# Patient Record
Sex: Male | Born: 1945 | Race: Black or African American | Hispanic: No | Marital: Married | Smoking: Former smoker
Health system: Southern US, Community
[De-identification: ages and names within clinical notes are randomized; demographics above are authoritative.]

## PROBLEM LIST (undated history)

## (undated) DIAGNOSIS — J441 Chronic obstructive pulmonary disease with (acute) exacerbation: Secondary | ICD-10-CM

## (undated) DIAGNOSIS — E119 Type 2 diabetes mellitus without complications: Secondary | ICD-10-CM

## (undated) DIAGNOSIS — E785 Hyperlipidemia, unspecified: Secondary | ICD-10-CM

## (undated) DIAGNOSIS — K219 Gastro-esophageal reflux disease without esophagitis: Secondary | ICD-10-CM

## (undated) DIAGNOSIS — I10 Essential (primary) hypertension: Secondary | ICD-10-CM

## (undated) DIAGNOSIS — M79659 Pain in unspecified thigh: Secondary | ICD-10-CM

## (undated) DIAGNOSIS — J449 Chronic obstructive pulmonary disease, unspecified: Secondary | ICD-10-CM

## (undated) DIAGNOSIS — Z860100 Personal history of colon polyps, unspecified: Secondary | ICD-10-CM

## (undated) DIAGNOSIS — D649 Anemia, unspecified: Secondary | ICD-10-CM

## (undated) DIAGNOSIS — K5790 Diverticulosis of intestine, part unspecified, without perforation or abscess without bleeding: Secondary | ICD-10-CM

## (undated) HISTORY — DX: Diverticulosis of intestine, part unspecified, without perforation or abscess without bleeding: K57.90

## (undated) HISTORY — DX: Type 2 diabetes mellitus without complications: E11.9

## (undated) HISTORY — DX: Chronic obstructive pulmonary disease with (acute) exacerbation: J44.1

## (undated) HISTORY — DX: Personal history of colon polyps, unspecified: Z86.0100

## (undated) HISTORY — DX: Hyperlipidemia, unspecified: E78.5

## (undated) HISTORY — DX: Chronic obstructive pulmonary disease, unspecified: J44.9

## (undated) HISTORY — DX: Pain in unspecified thigh: M79.659

## (undated) HISTORY — DX: Anemia, unspecified: D64.9

## (undated) HISTORY — DX: Gastro-esophageal reflux disease without esophagitis: K21.9

## (undated) HISTORY — DX: Personal history of colonic polyps: Z86.010

## (undated) HISTORY — DX: Essential (primary) hypertension: I10

---

## 1998-11-10 ENCOUNTER — Other Ambulatory Visit: Admission: RE | Admit: 1998-11-10 | Discharge: 1998-11-10 | Payer: Self-pay | Admitting: Internal Medicine

## 2000-09-30 ENCOUNTER — Other Ambulatory Visit: Admission: RE | Admit: 2000-09-30 | Discharge: 2000-09-30 | Payer: Self-pay | Admitting: Internal Medicine

## 2000-09-30 ENCOUNTER — Encounter (INDEPENDENT_AMBULATORY_CARE_PROVIDER_SITE_OTHER): Payer: Self-pay | Admitting: Specialist

## 2000-12-22 ENCOUNTER — Encounter: Payer: Self-pay | Admitting: Pulmonary Disease

## 2000-12-22 ENCOUNTER — Ambulatory Visit (HOSPITAL_COMMUNITY): Admission: RE | Admit: 2000-12-22 | Discharge: 2000-12-22 | Payer: Self-pay | Admitting: Pulmonary Disease

## 2001-02-24 ENCOUNTER — Encounter: Payer: Self-pay | Admitting: Emergency Medicine

## 2001-02-24 ENCOUNTER — Emergency Department (HOSPITAL_COMMUNITY): Admission: EM | Admit: 2001-02-24 | Discharge: 2001-02-25 | Payer: Self-pay | Admitting: Emergency Medicine

## 2001-07-03 ENCOUNTER — Inpatient Hospital Stay (HOSPITAL_COMMUNITY): Admission: EM | Admit: 2001-07-03 | Discharge: 2001-07-07 | Payer: Self-pay | Admitting: Emergency Medicine

## 2001-07-03 ENCOUNTER — Encounter: Payer: Self-pay | Admitting: Critical Care Medicine

## 2001-07-06 ENCOUNTER — Encounter: Payer: Self-pay | Admitting: Pulmonary Disease

## 2002-03-09 ENCOUNTER — Ambulatory Visit (HOSPITAL_COMMUNITY): Admission: RE | Admit: 2002-03-09 | Discharge: 2002-03-09 | Payer: Self-pay | Admitting: Endocrinology

## 2002-03-09 ENCOUNTER — Encounter: Payer: Self-pay | Admitting: Endocrinology

## 2002-04-25 HISTORY — PX: ESOPHAGOGASTRODUODENOSCOPY: SHX1529

## 2002-05-01 ENCOUNTER — Ambulatory Visit (HOSPITAL_COMMUNITY): Admission: RE | Admit: 2002-05-01 | Discharge: 2002-05-01 | Payer: Self-pay | Admitting: Internal Medicine

## 2002-05-01 ENCOUNTER — Encounter: Payer: Self-pay | Admitting: Internal Medicine

## 2003-03-10 ENCOUNTER — Inpatient Hospital Stay (HOSPITAL_COMMUNITY): Admission: EM | Admit: 2003-03-10 | Discharge: 2003-03-13 | Payer: Self-pay | Admitting: Emergency Medicine

## 2003-03-10 ENCOUNTER — Encounter: Payer: Self-pay | Admitting: Emergency Medicine

## 2003-03-11 ENCOUNTER — Encounter: Payer: Self-pay | Admitting: Critical Care Medicine

## 2004-04-25 ENCOUNTER — Ambulatory Visit (HOSPITAL_COMMUNITY): Admission: RE | Admit: 2004-04-25 | Discharge: 2004-04-25 | Payer: Self-pay | Admitting: Endocrinology

## 2004-06-30 ENCOUNTER — Encounter: Payer: Self-pay | Admitting: Critical Care Medicine

## 2004-09-10 ENCOUNTER — Ambulatory Visit: Payer: Self-pay | Admitting: Critical Care Medicine

## 2004-11-27 ENCOUNTER — Ambulatory Visit: Payer: Self-pay | Admitting: Endocrinology

## 2005-02-12 ENCOUNTER — Ambulatory Visit: Payer: Self-pay | Admitting: Endocrinology

## 2005-02-19 ENCOUNTER — Ambulatory Visit: Payer: Self-pay | Admitting: Endocrinology

## 2005-06-25 ENCOUNTER — Ambulatory Visit: Payer: Self-pay | Admitting: Endocrinology

## 2005-07-02 ENCOUNTER — Ambulatory Visit: Payer: Self-pay | Admitting: Endocrinology

## 2005-08-20 ENCOUNTER — Ambulatory Visit: Payer: Self-pay | Admitting: Critical Care Medicine

## 2005-12-20 ENCOUNTER — Ambulatory Visit: Payer: Self-pay | Admitting: Pulmonary Disease

## 2006-01-27 ENCOUNTER — Ambulatory Visit: Payer: Self-pay | Admitting: Critical Care Medicine

## 2006-03-28 ENCOUNTER — Ambulatory Visit: Payer: Self-pay | Admitting: Endocrinology

## 2006-04-18 ENCOUNTER — Ambulatory Visit: Payer: Self-pay | Admitting: Endocrinology

## 2006-08-11 ENCOUNTER — Ambulatory Visit: Payer: Self-pay | Admitting: Internal Medicine

## 2006-08-16 ENCOUNTER — Ambulatory Visit: Payer: Self-pay | Admitting: Pulmonary Disease

## 2006-08-18 ENCOUNTER — Ambulatory Visit: Payer: Self-pay | Admitting: Endocrinology

## 2006-08-30 ENCOUNTER — Ambulatory Visit: Payer: Self-pay | Admitting: Critical Care Medicine

## 2006-09-28 ENCOUNTER — Ambulatory Visit: Payer: Self-pay | Admitting: Critical Care Medicine

## 2006-12-01 ENCOUNTER — Ambulatory Visit: Payer: Self-pay | Admitting: Critical Care Medicine

## 2007-03-13 ENCOUNTER — Ambulatory Visit: Payer: Self-pay | Admitting: Internal Medicine

## 2007-03-27 ENCOUNTER — Encounter: Payer: Self-pay | Admitting: Internal Medicine

## 2007-03-27 ENCOUNTER — Ambulatory Visit: Payer: Self-pay | Admitting: Internal Medicine

## 2007-05-30 ENCOUNTER — Ambulatory Visit: Payer: Self-pay | Admitting: Critical Care Medicine

## 2007-06-02 ENCOUNTER — Ambulatory Visit: Payer: Self-pay | Admitting: Endocrinology

## 2007-06-02 LAB — CONVERTED CEMR LAB
AST: 32 units/L (ref 0–37)
Alkaline Phosphatase: 64 units/L (ref 39–117)
BUN: 14 mg/dL (ref 6–23)
Basophils Absolute: 0 10*3/uL (ref 0.0–0.1)
Basophils Relative: 0.4 % (ref 0.0–1.0)
Bilirubin Urine: NEGATIVE
Bilirubin, Direct: 0.1 mg/dL (ref 0.0–0.3)
Chloride: 100 meq/L (ref 96–112)
Cholesterol: 160 mg/dL (ref 0–200)
Creatinine, Ser: 1 mg/dL (ref 0.4–1.5)
Eosinophils Absolute: 0 10*3/uL (ref 0.0–0.6)
GFR calc Af Amer: 98 mL/min
Ketones, ur: NEGATIVE mg/dL
Leukocytes, UA: NEGATIVE
Lymphocytes Relative: 16.7 % (ref 12.0–46.0)
Monocytes Absolute: 0.3 10*3/uL (ref 0.2–0.7)
Monocytes Relative: 3.5 % (ref 3.0–11.0)
Neutrophils Relative %: 79.1 % — ABNORMAL HIGH (ref 43.0–77.0)
Nitrite: NEGATIVE
Platelets: 237 10*3/uL (ref 150–400)
Potassium: 3.8 meq/L (ref 3.5–5.1)
RBC: 4.22 M/uL (ref 4.22–5.81)
Total Bilirubin: 0.7 mg/dL (ref 0.3–1.2)
Total Protein: 7.2 g/dL (ref 6.0–8.3)
Urobilinogen, UA: 0.2 (ref 0.0–1.0)
VLDL: 19 mg/dL (ref 0–40)

## 2007-06-05 ENCOUNTER — Encounter: Payer: Self-pay | Admitting: Endocrinology

## 2007-06-05 DIAGNOSIS — Z860100 Personal history of colon polyps, unspecified: Secondary | ICD-10-CM | POA: Insufficient documentation

## 2007-06-05 DIAGNOSIS — E785 Hyperlipidemia, unspecified: Secondary | ICD-10-CM

## 2007-06-05 DIAGNOSIS — J209 Acute bronchitis, unspecified: Secondary | ICD-10-CM

## 2007-06-05 DIAGNOSIS — I10 Essential (primary) hypertension: Secondary | ICD-10-CM | POA: Insufficient documentation

## 2007-06-05 DIAGNOSIS — J440 Chronic obstructive pulmonary disease with acute lower respiratory infection: Secondary | ICD-10-CM

## 2007-06-05 DIAGNOSIS — E119 Type 2 diabetes mellitus without complications: Secondary | ICD-10-CM | POA: Insufficient documentation

## 2007-06-05 DIAGNOSIS — K219 Gastro-esophageal reflux disease without esophagitis: Secondary | ICD-10-CM

## 2007-06-06 ENCOUNTER — Ambulatory Visit: Payer: Self-pay | Admitting: Endocrinology

## 2007-07-03 ENCOUNTER — Ambulatory Visit: Payer: Self-pay | Admitting: Critical Care Medicine

## 2007-08-31 ENCOUNTER — Encounter: Payer: Self-pay | Admitting: Critical Care Medicine

## 2007-10-02 ENCOUNTER — Ambulatory Visit: Payer: Self-pay | Admitting: Critical Care Medicine

## 2007-10-02 ENCOUNTER — Ambulatory Visit: Payer: Self-pay | Admitting: Endocrinology

## 2007-10-02 DIAGNOSIS — J441 Chronic obstructive pulmonary disease with (acute) exacerbation: Secondary | ICD-10-CM

## 2007-10-03 LAB — CONVERTED CEMR LAB: Microalb Creat Ratio: 5.3 mg/g (ref 0.0–30.0)

## 2007-12-05 ENCOUNTER — Ambulatory Visit: Payer: Self-pay | Admitting: Endocrinology

## 2007-12-05 DIAGNOSIS — M79609 Pain in unspecified limb: Secondary | ICD-10-CM

## 2007-12-06 LAB — CONVERTED CEMR LAB
Basophils Relative: 0.5 % (ref 0.0–1.0)
Eosinophils Absolute: 0.2 10*3/uL (ref 0.0–0.6)
Folate: 5.6 ng/mL
HCT: 35.5 % — ABNORMAL LOW (ref 39.0–52.0)
Hgb A1c MFr Bld: 6.2 % — ABNORMAL HIGH (ref 4.6–6.0)
Iron: 65 ug/dL (ref 42–165)
Lymphocytes Relative: 31.2 % (ref 12.0–46.0)
MCV: 88.7 fL (ref 78.0–100.0)
Monocytes Relative: 11 % (ref 3.0–11.0)
Neutrophils Relative %: 54.1 % (ref 43.0–77.0)
RDW: 13.2 % (ref 11.5–14.6)

## 2008-01-08 ENCOUNTER — Telehealth (INDEPENDENT_AMBULATORY_CARE_PROVIDER_SITE_OTHER): Payer: Self-pay | Admitting: *Deleted

## 2008-03-05 ENCOUNTER — Ambulatory Visit: Payer: Self-pay | Admitting: Critical Care Medicine

## 2008-03-26 ENCOUNTER — Telehealth (INDEPENDENT_AMBULATORY_CARE_PROVIDER_SITE_OTHER): Payer: Self-pay | Admitting: *Deleted

## 2008-04-09 ENCOUNTER — Ambulatory Visit: Payer: Self-pay | Admitting: Critical Care Medicine

## 2008-06-17 ENCOUNTER — Encounter: Payer: Self-pay | Admitting: Endocrinology

## 2008-07-15 ENCOUNTER — Ambulatory Visit: Payer: Self-pay | Admitting: Critical Care Medicine

## 2008-07-29 ENCOUNTER — Ambulatory Visit: Payer: Self-pay | Admitting: Endocrinology

## 2008-07-29 DIAGNOSIS — N320 Bladder-neck obstruction: Secondary | ICD-10-CM | POA: Insufficient documentation

## 2008-07-31 LAB — CONVERTED CEMR LAB
ALT: 41 units/L (ref 0–53)
Albumin: 4.1 g/dL (ref 3.5–5.2)
Bacteria, UA: NEGATIVE
Bilirubin Urine: NEGATIVE
Bilirubin, Direct: 0.1 mg/dL (ref 0.0–0.3)
Cholesterol: 158 mg/dL (ref 0–200)
Crystals: NEGATIVE
Eosinophils Absolute: 0.2 10*3/uL (ref 0.0–0.7)
GFR calc Af Amer: 88 mL/min
GFR calc non Af Amer: 72 mL/min
HCT: 35.7 % — ABNORMAL LOW (ref 39.0–52.0)
HDL: 71.3 mg/dL (ref >39.0)
Hemoglobin: 11.7 g/dL — ABNORMAL LOW (ref 13.0–17.0)
Hgb A1c MFr Bld: 5.9 % (ref 4.6–6.0)
Iron: 62 ug/dL (ref 42–165)
Ketones, ur: NEGATIVE mg/dL
LDL Cholesterol: 75 mg/dL (ref 0–99)
Leukocytes, UA: NEGATIVE
Lymphocytes Relative: 18 % (ref 12.0–46.0)
MCHC: 32.7 g/dL (ref 30.0–36.0)
Mucus, UA: NEGATIVE
Nitrite: NEGATIVE
PSA: 0.18 ng/mL (ref 0.10–4.00)
Potassium: 3.8 meq/L (ref 3.5–5.1)
RBC: 3.93 M/uL — ABNORMAL LOW (ref 4.22–5.81)
RDW: 14 % (ref 11.5–14.6)
Saturation Ratios: 16.1 % — ABNORMAL LOW (ref 20.0–50.0)
Sodium: 139 meq/L (ref 135–145)
Total CHOL/HDL Ratio: 2.2
Total Protein, Urine: NEGATIVE mg/dL
Total Protein: 7 g/dL (ref 6.0–8.3)
Transferrin: 275.7 mg/dL (ref >=212.0)
Triglycerides: 58 mg/dL (ref 0–149)
Urine Glucose: NEGATIVE mg/dL
Urobilinogen, UA: 0.2 (ref 0.0–1.0)
VLDL: 12 mg/dL (ref 0–40)
WBC: 9.5 10*3/uL (ref 4.5–10.5)

## 2008-08-16 ENCOUNTER — Encounter: Payer: Self-pay | Admitting: Critical Care Medicine

## 2008-08-29 ENCOUNTER — Encounter (INDEPENDENT_AMBULATORY_CARE_PROVIDER_SITE_OTHER): Payer: Self-pay | Admitting: *Deleted

## 2008-09-02 ENCOUNTER — Ambulatory Visit: Payer: Self-pay | Admitting: Endocrinology

## 2008-09-02 DIAGNOSIS — D509 Iron deficiency anemia, unspecified: Secondary | ICD-10-CM | POA: Insufficient documentation

## 2008-09-02 DIAGNOSIS — R05 Cough: Secondary | ICD-10-CM

## 2008-09-16 ENCOUNTER — Ambulatory Visit: Payer: Self-pay | Admitting: Internal Medicine

## 2008-09-17 ENCOUNTER — Telehealth (INDEPENDENT_AMBULATORY_CARE_PROVIDER_SITE_OTHER): Payer: Self-pay | Admitting: *Deleted

## 2008-09-30 ENCOUNTER — Ambulatory Visit: Payer: Self-pay | Admitting: Internal Medicine

## 2008-09-30 DIAGNOSIS — K2970 Gastritis, unspecified, without bleeding: Secondary | ICD-10-CM | POA: Insufficient documentation

## 2008-09-30 DIAGNOSIS — K2990 Gastroduodenitis, unspecified, without bleeding: Secondary | ICD-10-CM

## 2008-09-30 LAB — CONVERTED CEMR LAB: UREASE: NEGATIVE

## 2008-10-08 ENCOUNTER — Ambulatory Visit: Payer: Self-pay | Admitting: Critical Care Medicine

## 2009-05-05 ENCOUNTER — Ambulatory Visit: Payer: Self-pay | Admitting: Critical Care Medicine

## 2009-08-25 ENCOUNTER — Ambulatory Visit: Payer: Self-pay | Admitting: Critical Care Medicine

## 2009-09-01 ENCOUNTER — Telehealth (INDEPENDENT_AMBULATORY_CARE_PROVIDER_SITE_OTHER): Payer: Self-pay | Admitting: *Deleted

## 2009-09-22 ENCOUNTER — Ambulatory Visit: Payer: Self-pay | Admitting: Critical Care Medicine

## 2009-09-22 DIAGNOSIS — J328 Other chronic sinusitis: Secondary | ICD-10-CM | POA: Insufficient documentation

## 2009-10-06 ENCOUNTER — Ambulatory Visit: Payer: Self-pay | Admitting: Cardiology

## 2009-10-07 ENCOUNTER — Encounter: Payer: Self-pay | Admitting: Critical Care Medicine

## 2009-10-27 ENCOUNTER — Telehealth: Payer: Self-pay | Admitting: Critical Care Medicine

## 2009-10-28 ENCOUNTER — Telehealth: Payer: Self-pay | Admitting: Critical Care Medicine

## 2010-03-17 ENCOUNTER — Telehealth: Payer: Self-pay | Admitting: Critical Care Medicine

## 2010-03-31 ENCOUNTER — Ambulatory Visit: Payer: Self-pay | Admitting: Critical Care Medicine

## 2010-05-04 ENCOUNTER — Telehealth (INDEPENDENT_AMBULATORY_CARE_PROVIDER_SITE_OTHER): Payer: Self-pay | Admitting: *Deleted

## 2010-05-12 ENCOUNTER — Ambulatory Visit: Payer: Self-pay | Admitting: Critical Care Medicine

## 2010-06-10 ENCOUNTER — Encounter: Payer: Self-pay | Admitting: Critical Care Medicine

## 2010-07-14 ENCOUNTER — Ambulatory Visit: Payer: Self-pay | Admitting: Critical Care Medicine

## 2010-07-24 ENCOUNTER — Ambulatory Visit: Payer: Self-pay | Admitting: Critical Care Medicine

## 2010-11-14 ENCOUNTER — Encounter: Payer: Self-pay | Admitting: Endocrinology

## 2010-11-24 NOTE — Assessment & Plan Note (Signed)
Summary: Pulmonary OV   Primary Provider/Referring Provider:  Dr Romero Belling  CC:  1 wk follow up.  Pt states he is doing better.  Dry cough mainly at night - requesting cough syrup for this.  Denies SOB, wheezing, and chest tightness.  Requesting flu vac.Marland Kitchen  History of Present Illness:  65  years old male with known history of COPD and asthmatic bronchitis   March 31, 2010 --Presents for an acute office visit. Complains of prod cough with thick brown mucus over last 3-4 weeks . Worse for last week, keeping him up at night. Denies chest pain, dyspnea, orthopnea, hemoptysis, fever, n/v/d, edema, headache,recent travel or antibiotics.  Uisng nyquil wihtout relief.    May 12, 2010 9:21 AM The pt was ok but every night will choke up and mucus at night.  The mucus was  white and green and now is brown.  This started 3-4 days after TP saw the pt on 03/31/10.  Has a cold all the time.  Dyspnea is ok, just cough.  Not much nasal drip.  No sinus pressure or headaches.  No smoking.  The pt is now working in [place] area.  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  No actual dust.  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    July 14, 2010 2:10 PM  Pt is worse with more cough especially at night.  Has to use nebulizer at night. Worse in damp weather.  No sinus drip.  No real chest pain ,  coughing up thick brown mucus more wheezing is noted.  Notes more heartburn.    July 24, 2010 3:46 PM Doing better.  Not as much cough and is at night and is dry.  Not using nebulizer now.  No chest pain. Wheezing is less.  Not as much brown mucus.     Preventive Screening-Counseling & Management  Alcohol-Tobacco     Smoking Status: quit > 6 months     Year Quit: 1968     Pack years: 40yrs, 1ppd  Current Medications (verified): 1)  Vytorin 10-80 Mg  Tabs (Ezetimibe-Simvastatin) .... Take 1 By Mouth At Bedtime 2)  Flomax 0.4 Mg  Cp24 (Tamsulosin Hcl) .... One By Mouth  Daily 3)  Diovan Hct 320-25 Mg  Tabs (Valsartan-Hydrochlorothiazide) .... Take 1 By Mouth Once Daily 4)  Metformin Hcl 500 Mg Xr24h-Tab (Metformin Hcl) .... Once Daily 5)  Advair Diskus 500-50 Mcg/dose Misc (Fluticasone-Salmeterol) .... Inhale 1 Puff Two Times A Day 6)  Spiriva Handihaler 18 Mcg Caps (Tiotropium Bromide Monohydrate) .... Inhale Contents of 1 Capsule Once A Day 7)  Singulair 10 Mg Tabs (Montelukast Sodium) .... Take 1 Tablet By Mouth Once A Day 8)  Ventolin Hfa 108 (90 Base) Mcg/act Aers (Albuterol Sulfate) .... Inhale 2 Puffs Every Four Hours As Needed 9)  Prednisone 10 Mg  Tabs (Prednisone) .... Take As Directed 4 Each Am X3days, 3 X 3days, 2 X 3days, 1 X 3days Then Stop  Allergies (verified): No Known Drug Allergies  Past History:  Past medical, surgical, family and social histories (including risk factors) reviewed, and no changes noted (except as noted below).  Past Medical History: Reviewed history from 07/15/2008 and no changes required. THIGH PAIN (ICD-729.5) UNSPECIFIED ANEMIA (ICD-285.9) BRONCHITIS, OBSTRUCTIVE CHRONIC, WITH EXACERBATION (ICD-491.21) HYPERTENSION (ICD-401.9) HYPERLIPIDEMIA (ICD-272.4) GERD (ICD-530.81) DIABETES MELLITUS, TYPE II (ICD-250.00) COPD (ICD-496) COLONIC POLYPS, HX OF (ICD-V12.72)    Past Surgical History: Reviewed history from 06/05/2007 and  no changes required. EGD (04/25/2002  Family History: Reviewed history from 10/02/2007 and no changes required. Peptic ulcer dz father with throat CA  Social History: Reviewed history from 07/14/2010 and no changes required. former smoker, x10-48yrs, less than 1ppd.  Quit in 1970s. married 5 children drinks some whiskey on the weekends drinks 2 cups caffeine a day states no exercise  Review of Systems       The patient complains of shortness of breath with activity and non-productive cough.  The patient denies shortness of breath at rest, productive cough, coughing up blood,  chest pain, irregular heartbeats, acid heartburn, indigestion, loss of appetite, weight change, abdominal pain, difficulty swallowing, sore throat, tooth/dental problems, headaches, nasal congestion/difficulty breathing through nose, sneezing, itching, ear ache, anxiety, depression, hand/feet swelling, joint stiffness or pain, rash, change in color of mucus, and fever.    Vital Signs:  Patient profile:   65 year old male Height:      66 inches Weight:      229 pounds BMI:     37.10 O2 Sat:      95 % on Room air Temp:     98.3 degrees F oral Pulse rate:   74 / minute BP sitting:   132 / 84  (left arm) Cuff size:   regular  Vitals Entered By: Gweneth Dimitri RN (July 24, 2010 3:37 PM)  O2 Flow:  Room air CC: 1 wk follow up.  Pt states he is doing better.  Dry cough mainly at night - requesting cough syrup for this.  Denies SOB, wheezing, chest tightness.  Requesting flu vac. Comments Medications reviewed with patient Daytime contact number verified with patient. Gweneth Dimitri RN  July 24, 2010 3:37 PM    Physical Exam  Additional Exam:  Gen: Pleasant, well-nourished, in no distress , normal affect ENT: no lesions, purulent nares bilaterally persists Neck: No JVD, no TMG, no carotid bruits Lungs: No use of accessory muscles, no dullness to percussion, decreased wheeze and rhonchi Cardiovascular: RRR, heart sounds normal, no murmurs or gallops, no peripheral edema Abdomen: soft and non-tender, no HSM, BS normal Musculoskeletal: No deformities, no cyanosis or clubbing Neuro: alert, non-focal     Impression & Recommendations:  Problem # 1:  BRONCHITIS, OBSTRUCTIVE CHRONIC, WITH EXACERBATION (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   Maintain treatment program as currently prescribed. as needed codeine cough syrup Orders: Est. Patient Level III (09811)  Medications Added to Medication List  This Visit: 1)  Promethazine-codeine 6.25-10 Mg/17ml Syrp (Promethazine-codeine) .... 5 ml by mouth every 6 hours as needed cough  Complete Medication List: 1)  Vytorin 10-80 Mg Tabs (Ezetimibe-simvastatin) .... Take 1 by mouth at bedtime 2)  Flomax 0.4 Mg Cp24 (Tamsulosin hcl) .... One by mouth daily 3)  Diovan Hct 320-25 Mg Tabs (Valsartan-hydrochlorothiazide) .... Take 1 by mouth once daily 4)  Metformin Hcl 500 Mg Xr24h-tab (Metformin hcl) .... Once daily 5)  Advair Diskus 500-50 Mcg/dose Misc (Fluticasone-salmeterol) .... Inhale 1 puff two times a day 6)  Spiriva Handihaler 18 Mcg Caps (Tiotropium bromide monohydrate) .... Inhale contents of 1 capsule once a day 7)  Singulair 10 Mg Tabs (Montelukast sodium) .... Take 1 tablet by mouth once a day 8)  Ventolin Hfa 108 (90 Base) Mcg/act Aers (Albuterol sulfate) .... Inhale 2 puffs every four hours as needed 9)  Promethazine-codeine 6.25-10 Mg/105ml Syrp (Promethazine-codeine) .... 5 ml by mouth every 6 hours as needed cough  Other  Orders: Admin 1st Vaccine (09811) Flu Vaccine 71yrs + [id number])  Patient Instructions: 1)  Flu vaccine  2)  No other changes in medications 3)  Return 3 months  Prescriptions: PROMETHAZINE-CODEINE 6.25-10 MG/5ML SYRP (PROMETHAZINE-CODEINE) 5 ML by mouth every 6 hours as needed cough  #240ML x 0   Entered and Authorized by:   Storm Frisk MD   Signed by:   Storm Frisk MD on 07/24/2010   Method used:   Print then Give to Patient   RxID:   [id number]              Flu Vaccine Consent Questions     Do you have a history of severe allergic reactions to this vaccine? no    Any prior history of allergic reactions to egg and/or gelatin? no    Do you have a sensitivity to the preservative Thimersol? no    Do you have a past history of Guillan-Barre Syndrome? no    Do you currently have an acute febrile illness? no    Have you ever had a severe reaction to latex? no    Vaccine information  given and explained to patient? yes    Are you currently pregnant? no    Lot Number:[id number]   Exp Date:04/24/2011   Site Given  Left Deltoid IMflu Zackery Barefoot CMA  July 24, 2010 4:42 PM

## 2010-11-24 NOTE — Progress Notes (Signed)
Summary: samples  Phone Note Call from Patient Call back at [phone]   Caller: Patient Call For: Kalecia Hartney Reason for Call: Talk to Nurse Summary of Call: can he get Spiriva samples to last until Friday?  He gets his new insurance card then. Initial call taken by: Eugene Gavia,  October 27, 2009 3:17 PM  Follow-up for Phone Call        pt advised samples at front. [name] CMA  October 27, 2009 3:20 PM

## 2010-11-24 NOTE — Progress Notes (Signed)
Summary: samples  Phone Note Call from Patient Call back at [phone]   Caller: Patient Call For: Monserrate Blaschke Reason for Call: Talk to Nurse Summary of Call: Pt called to see if his ,ail order had sent this med--they had not.  They have processed his order now, but it will take 7-10 days.  Can he get samples of Spiriva and Diovan Initial call taken by: Eugene Gavia,  Mar 17, 2010 9:27 AM  Follow-up for Phone Call        pt advised samples of spiriva nad diovan left at front. [name] CMA  Mar 17, 2010 10:16 AM

## 2010-11-24 NOTE — Assessment & Plan Note (Signed)
Summary: Acute NP office visit - COPD, bronchitis   Primary Provider/Referring Provider:  Dr Romero Belling  CC:  prod cough with thick brown mucus x15months - denies wheezing, SOB, and f/c/s.  History of Present Illness:  65  years old male with known history of COPD and asthmatic bronchitis    9/21:follow up. now works  in [place].  very humid climate. now :  some at bedtime cough.  cough is dry this week.  last week had brown yellow mucous. no cp.  sl wheeze.    May 05, 2009--Presents for an acute office visit. Complains of prod cough with light brown mucus, was darker over the weekend x1week. Using mucinex which is helping. Intermittent wheezing. Denies chest pain, dyspnea, orthopnea, hemoptysis, fever, n/v/d, edema, headache,recent travel or antibiotics.   August 25, 2009 4:23 PM Pt notes having a "cold" for month:  His symptoms are more cough in night and early in the am and mucous is brown. No wheeze.  Not as dyspneic.  No chest pain.  No f/c/s. On advair and spiriva daily  September 22, 2009 10:03 AM At last ov we rx: Follow Reflux diet Take aciphex one a day until samples gone then stop Stay on inhalers as prescribed Prednisone pulse Augmentin twice daily for 10days The pt feels better now. The pt notes that  after off meds will start coughing brown mucous  again.   No sinus drainage. No sinus pressure.  No fever.  Now no wheeze. The pt  is dyspneic with exertion. March 31, 2010 --Presents for an acute office visit. Complains of prod cough with thick brown mucus over last 3-4 weeks . Worse for last week, keeping him up at night. Denies chest pain, dyspnea, orthopnea, hemoptysis, fever, n/v/d, edema, headache,recent travel or antibiotics.  Uisng nyquil wihtout relief.   Medications Prior to Update: 1)  Vytorin 10-80 Mg  Tabs (Ezetimibe-Simvastatin) .... Take 1 By Mouth At Bedtime 2)  Flomax 0.4 Mg  Cp24 (Tamsulosin Hcl) .... One By Mouth Daily 3)  Diovan Hct 320-25 Mg  Tabs  (Valsartan-Hydrochlorothiazide) .... Take 1 By Mouth Once Daily 4)  Robaxin-750 750 Mg  Tabs (Methocarbamol) .... Take 1 Tablet By Mouth Once A Day As Needed For Cramps 5)  Metformin Hcl 500 Mg Xr24h-Tab (Metformin Hcl) .... Qd 6)  Tussionex Pennkinetic Er 8-10 Mg/52ml Lqcr (Chlorpheniramine-Hydrocodone) .Marland Kitchen.. 1 Tsp Two Times A Day As Needed Cough, May Make You Sleepy 7)  Advair Diskus 500-50 Mcg/dose Misc (Fluticasone-Salmeterol) .... Inhale 1 Puff Two Times A Day 8)  Spiriva Handihaler 18 Mcg Caps (Tiotropium Bromide Monohydrate) .... Inhale Contents of 1 Capsule Once A Day 9)  Singulair 10 Mg Tabs (Montelukast Sodium) .... Take 1 Tablet By Mouth Once A Day 10)  Ventolin Hfa 108 (90 Base) Mcg/act Aers (Albuterol Sulfate) .... Inhale 2 Puffs Every Four Hours As Needed  Current Medications (verified): 1)  Vytorin 10-80 Mg  Tabs (Ezetimibe-Simvastatin) .... Take 1 By Mouth At Bedtime 2)  Flomax 0.4 Mg  Cp24 (Tamsulosin Hcl) .... One By Mouth Daily 3)  Diovan Hct 320-25 Mg  Tabs (Valsartan-Hydrochlorothiazide) .... Take 1 By Mouth Once Daily 4)  Robaxin-750 750 Mg  Tabs (Methocarbamol) .... Take 1 Tablet By Mouth Once A Day As Needed For Cramps 5)  Metformin Hcl 500 Mg Xr24h-Tab (Metformin Hcl) .... Once Daily 6)  Advair Diskus 500-50 Mcg/dose Misc (Fluticasone-Salmeterol) .... Inhale 1 Puff Two Times A Day 7)  Spiriva Handihaler 18 Mcg Caps (Tiotropium Bromide  Monohydrate) .... Inhale Contents of 1 Capsule Once A Day 8)  Singulair 10 Mg Tabs (Montelukast Sodium) .... Take 1 Tablet By Mouth Once A Day 9)  Ventolin Hfa 108 (90 Base) Mcg/act Aers (Albuterol Sulfate) .... Inhale 2 Puffs Every Four Hours As Needed  Allergies (verified): No Known Drug Allergies  Past History:  Past Medical History: Last updated: 07/15/2008 THIGH PAIN (ICD-729.5) UNSPECIFIED ANEMIA (ICD-285.9) BRONCHITIS, OBSTRUCTIVE CHRONIC, WITH EXACERBATION (ICD-491.21) HYPERTENSION (ICD-401.9) HYPERLIPIDEMIA  (ICD-272.4) GERD (ICD-530.81) DIABETES MELLITUS, TYPE II (ICD-250.00) COPD (ICD-496) COLONIC POLYPS, HX OF (ICD-V12.72)    Family History: Last updated: 10/02/2007 Peptic ulcer dz father with throat CA  Social History: Last updated: 05/05/2009 former smoker, x10-43yrs, less than 1ppd married 5 children drinks some whiskey on the weekends drinks 2 cups caffeine a day states no exercise  Risk Factors: Smoking Status: quit > 6 months (09/22/2009)  Review of Systems      See HPI  Vital Signs:  Patient profile:   65 year old male Height:      66 inches Weight:      215 pounds BMI:     34.83 O2 Sat:      96 % on Room air Temp:     97.6 degrees F oral Pulse rate:   79 / minute BP sitting:   144 / 94  (left arm) Cuff size:   regular  Vitals Entered By: Boone Master CNA/MA (March 31, 2010 10:24 AM)  O2 Flow:  Room air CC: prod cough with thick brown mucus x64months - denies wheezing, SOB, f/c/s Is Patient Diabetic? Yes Comments Medications reviewed with patient Daytime contact number verified with patient. Boone Master CNA/MA  March 31, 2010 10:23 AM    Physical Exam  Additional Exam:  Gen: Pleasant, well-nourished, in no distress , normal affect ENT: no lesions, purulent nares bilaterally persists Neck: No JVD, no TMG, no carotid bruits Lungs: No use of accessory muscles, no dullness to percussion, insp/exp wheeze, scattered rhonchi Cardiovascular: RRR, heart sounds normal, no murmurs or gallops, no peripheral edema Abdomen: soft and non-tender, no HSM, BS normal Musculoskeletal: No deformities, no cyanosis or clubbing Neuro: alert, non-focal     Impression & Recommendations:  Problem # 1:  BRONCHITIS, OBSTRUCTIVE CHRONIC, WITH EXACERBATION (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  Hydromet  1-2 tsp every 4-6 hr as needed cough, may make you sleepy.  Please contact office for sooner follow up if symptoms do not improve or worsen   Orders: Est. Patient Level IV (29562) T-2 View CXR (71020TC)  Medications Added to Medication List This Visit: 1)  Metformin Hcl 500 Mg Xr24h-tab (Metformin hcl) .... Once daily 2)  Augmentin 875-125 Mg Tabs (Amoxicillin-pot clavulanate) .Marland Kitchen.. 1 by mouth two times a day 3)  Prednisone 10 Mg Tabs (Prednisone) .... 4 tabs for 2 days, then 3 tabs for 2 days, 2 tabs for 2 days, then 1 tab for 2 days, then stop 4)  Hydromet 5-1.5 Mg/65ml Syrp (Hydrocodone-homatropine) .Marland Kitchen.. 1-2 tsp every 4-6 hrs as needed cough  Complete Medication List: 1)  Vytorin 10-80 Mg Tabs (Ezetimibe-simvastatin) .... Take 1 by mouth at bedtime 2)  Flomax 0.4 Mg Cp24 (Tamsulosin hcl) .... One by mouth daily 3)  Diovan Hct 320-25 Mg Tabs (Valsartan-hydrochlorothiazide) .... Take 1 by mouth  once daily 4)  Robaxin-750 750 Mg Tabs (Methocarbamol) .... Take 1 tablet by mouth once a day as needed for cramps 5)  Metformin Hcl 500 Mg Xr24h-tab (Metformin hcl) .... Once daily 6)  Advair Diskus 500-50 Mcg/dose Misc (Fluticasone-salmeterol) .... Inhale 1 puff two times a day 7)  Spiriva Handihaler 18 Mcg Caps (Tiotropium bromide monohydrate) .... Inhale contents of 1 capsule once a day 8)  Singulair 10 Mg Tabs (Montelukast sodium) .... Take 1 tablet by mouth once a day 9)  Ventolin Hfa 108 (90 Base) Mcg/act Aers (Albuterol sulfate) .... Inhale 2 puffs every four hours as needed 10)  Augmentin 875-125 Mg Tabs (Amoxicillin-pot clavulanate) .Marland Kitchen.. 1 by mouth two times a day 11)  Prednisone 10 Mg Tabs (Prednisone) .... 4 tabs for 2 days, then 3 tabs for 2 days, 2 tabs for 2 days, then 1 tab for 2 days, then stop 12)  Hydromet 5-1.5 Mg/59ml Syrp (Hydrocodone-homatropine) .Marland Kitchen.. 1-2 tsp every 4-6 hrs as needed cough  Patient Instructions: 1)  Augmentin 875mg  two times a day for 7 days 2)  Mucinex DM two  times a day as needed cough/congestion  3)  Prednisone taper over next week  4)  I will call with xray results.  5)  Hydromet 1-2 tsp every 4-6 hr as needed cough, may make you sleepy.  6)  Please contact office for sooner follow up if symptoms do not improve or worsen  7)  follow up Dr. Delford Field in 4 weeks AT [facility]  Prescriptions: HYDROMET 5-1.5 MG/5ML SYRP (HYDROCODONE-HOMATROPINE) 1-2 tsp every 4-6 hrs as needed cough  #8 oz x 0   Entered and Authorized by:   Rubye Oaks NP   Signed by:   Tammy Parrett NP on 03/31/2010   Method used:   Print then Give to Patient   RxID:   [id number] PREDNISONE 10 MG TABS (PREDNISONE) 4 tabs for 2 days, then 3 tabs for 2 days, 2 tabs for 2 days, then 1 tab for 2 days, then stop  #20 x 0   Entered and Authorized by:   Rubye Oaks NP   Signed by:   Rubye Oaks NP on 03/31/2010   Method used:   Electronically to        [facility] [phone]* (retail)       [address] [place], [place]  [address]       Ph: [phone]       Fax: [phone]   RxID:   [id number] AUGMENTIN 875-125 MG TABS (AMOXICILLIN-POT CLAVULANATE) 1 by mouth two times a day  #14 x 0   Entered and Authorized by:   Rubye Oaks NP   Signed by:   Rubye Oaks NP on 03/31/2010   Method used:   Electronically to        [facility] [phone]* (retail)       [address] [place], [place]  [address]       Ph: [phone]       Fax: [phone]   RxID:   [id number]     Appended Document: Orders Update - neb tx     Clinical Lists Changes  Orders: Added new Service order of Nebulizer Tx (20254) - Signed

## 2010-11-24 NOTE — Progress Notes (Signed)
Summary: nebulizer   [id number]  Phone Note Call from Patient Call back at Home Phone [phone]   Caller: Patient Call For: wright Summary of Call: pts nebulizer has broken and is not working what should he do Initial call taken by: Lacinda Axon,  May 04, 2010 12:17 PM  Follow-up for Phone Call        [facility].  Pt just needs to contact his DME company regarding this if he believes his neb machine is broken.  Aundra Millet Reynolds LPN  May 04, 2010 12:19 PM   per dr Delford Field ok for order for new neb machine--will send to [facility] Follow-up by: Philipp Deputy CMA,  May 04, 2010 4:31 PM

## 2010-11-24 NOTE — Progress Notes (Signed)
Summary: medication  Phone Note Call from Patient Call back at [phone]   Caller: Patient Call For: [name] Summary of Call: need regular medication change to 3 month supply and mail to pt Initial call taken by: Rickard Patience,  October 28, 2009 10:39 AM  Follow-up for Phone Call        pt request rx for adviar, singulair, spiriva and ventolin 90 day supply with refills be mailed to pt. I will print and give to PW to sign., and then mail to pt. Pt aware. [name] CMA  October 28, 2009 11:41 AM     Prescriptions: VENTOLIN HFA 108 (90 BASE) MCG/ACT AERS (ALBUTEROL SULFATE) Inhale 2 puffs every four hours as needed  #3 x 3   Entered by:   [name] CMA   Authorized by:   Storm Frisk MD   Signed by:   [name] CMA on 10/28/2009   Method used:   Print then Mail to Patient   RxID:   [id number] SINGULAIR 10 MG TABS (MONTELUKAST SODIUM) Take 1 tablet by mouth once a day  #90 x 3   Entered by:   [name] CMA   Authorized by:   Storm Frisk MD   Signed by:   [name] CMA on 10/28/2009   Method used:   Print then Mail to Patient   RxID:   [id number] SPIRIVA HANDIHALER 18 MCG CAPS (TIOTROPIUM BROMIDE MONOHYDRATE) Inhale contents of 1 capsule once a day  #90 x 3   Entered by:   [name] CMA   Authorized by:   Storm Frisk MD   Signed by:   [name] CMA on 10/28/2009   Method used:   Print then Mail to Patient   RxID:   [id number] ADVAIR DISKUS 500-50 MCG/DOSE MISC (FLUTICASONE-SALMETEROL) Inhale 1 puff two times a day  #3 x 3   Entered by:   [name] CMA   Authorized by:   Storm Frisk MD   Signed by:   [name] CMA on 10/28/2009   Method used:   Print then Mail to Patient   RxID:   [id number]   Appended Document: medication rxs signed by PW and mailed to pt.

## 2010-11-24 NOTE — Assessment & Plan Note (Signed)
Summary: Pulmonary OV   Primary Provider/Referring Provider:  Dr Romero Belling  CC:  Acute Visit.  increased SOB, wheezing, and chest tightness at night since Saturday night.  Prod cough with thick brown mucus during the day.  Denies f/c/s.Marland Kitchen  History of Present Illness:  65  years old male with known history of COPD and asthmatic bronchitis   March 31, 2010 --Presents for an acute office visit. Complains of prod cough with thick brown mucus over last 3-4 weeks . Worse for last week, keeping him up at night. Denies chest pain, dyspnea, orthopnea, hemoptysis, fever, n/v/d, edema, headache,recent travel or antibiotics.  Uisng nyquil wihtout relief.    May 12, 2010 9:21 AM The pt was ok but every night will choke up and mucus at night.  The mucus was  white and green and now is brown.  This started 3-4 days after TP saw the pt on 03/31/10.  Has a cold all the time.  Dyspnea is ok, just cough.  Not much nasal drip.  No sinus pressure or headaches.  No smoking.  The pt is now working in [place] area.  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  No actual dust.  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    July 14, 2010 2:10 PM  Pt is worse with more cough especially at night.  Has to use nebulizer at night. Worse in damp weather.  No sinus drip.  No real chest pain ,  coughing up thick brown mucus more wheezing is noted.  Notes more heartburn.    Asthma History    Initial Asthma Severity Rating:    Age range: 12+ years    Symptoms: throughout the day    Nighttime Awakenings: often 7/week    Interferes w/ normal activity: some limitations    SABA use (not for EIB): daily    Asthma Severity Assessment: Severe Persistent   Preventive Screening-Counseling & Management  Alcohol-Tobacco     Smoking Status: quit > 6 months     Year Quit: 1968     Pack years: 69yrs, 1ppd  Current Medications (verified): 1)  Vytorin 10-80 Mg  Tabs (Ezetimibe-Simvastatin) .... Take 1  By Mouth At Bedtime 2)  Flomax 0.4 Mg  Cp24 (Tamsulosin Hcl) .... One By Mouth Daily 3)  Diovan Hct 320-25 Mg  Tabs (Valsartan-Hydrochlorothiazide) .... Take 1 By Mouth Once Daily 4)  Metformin Hcl 500 Mg Xr24h-Tab (Metformin Hcl) .... Once Daily 5)  Advair Diskus 500-50 Mcg/dose Misc (Fluticasone-Salmeterol) .... Inhale 1 Puff Two Times A Day 6)  Spiriva Handihaler 18 Mcg Caps (Tiotropium Bromide Monohydrate) .... Inhale Contents of 1 Capsule Once A Day 7)  Singulair 10 Mg Tabs (Montelukast Sodium) .... Take 1 Tablet By Mouth Once A Day 8)  Ventolin Hfa 108 (90 Base) Mcg/act Aers (Albuterol Sulfate) .... Inhale 2 Puffs Every Four Hours As Needed  Allergies (verified): No Known Drug Allergies  Past History:  Past medical, surgical, family and social histories (including risk factors) reviewed, and no changes noted (except as noted below).  Past Medical History: Reviewed history from 07/15/2008 and no changes required. THIGH PAIN (ICD-729.5) UNSPECIFIED ANEMIA (ICD-285.9) BRONCHITIS, OBSTRUCTIVE CHRONIC, WITH EXACERBATION (ICD-491.21) HYPERTENSION (ICD-401.9) HYPERLIPIDEMIA (ICD-272.4) GERD (ICD-530.81) DIABETES MELLITUS, TYPE II (ICD-250.00) COPD (ICD-496) COLONIC POLYPS, HX OF (ICD-V12.72)    Past Surgical History: Reviewed history from 06/05/2007 and no changes required. EGD (04/25/2002  Family History: Reviewed history from 10/02/2007 and no changes required. Peptic  ulcer dz father with throat CA  Social History: Reviewed history from 05/05/2009 and no changes required. former smoker, x10-50yrs, less than 1ppd.  Quit in 1970s. married 5 children drinks some whiskey on the weekends drinks 2 cups caffeine a day states no exercise  Review of Systems       The patient complains of shortness of breath with activity, shortness of breath at rest, productive cough, non-productive cough, chest pain, acid heartburn, indigestion, and change in color of mucus.  The patient  denies coughing up blood, irregular heartbeats, loss of appetite, weight change, abdominal pain, difficulty swallowing, sore throat, tooth/dental problems, headaches, nasal congestion/difficulty breathing through nose, sneezing, itching, ear ache, anxiety, depression, hand/feet swelling, joint stiffness or pain, rash, and fever.    Vital Signs:  Patient profile:   65 year old male Height:      66 inches Weight:      224 pounds BMI:     36.29 O2 Sat:      95 % on Room air Temp:     98.1 degrees F oral Pulse rate:   92 / minute BP sitting:   120 / 84  (left arm) Cuff size:   regular  Vitals Entered By: Gweneth Dimitri RN (July 14, 2010 1:46 PM)  O2 Flow:  Room air CC: Acute Visit.  increased SOB, wheezing, chest tightness at night since Saturday night.  Prod cough with thick brown mucus during the day.  Denies f/c/s. Comments Medications reviewed with patient Daytime contact number verified with patient. Gweneth Dimitri RN  July 14, 2010 1:47 PM    Physical Exam  Additional Exam:  Gen: Pleasant, well-nourished, in no distress , normal affect ENT: no lesions, purulent nares bilaterally persists Neck: No JVD, no TMG, no carotid bruits Lungs: No use of accessory muscles, no dullness to percussion, insp/exp wheeze, scattered rhonchi Cardiovascular: RRR, heart sounds normal, no murmurs or gallops, no peripheral edema Abdomen: soft and non-tender, no HSM, BS normal Musculoskeletal: No deformities, no cyanosis or clubbing Neuro: alert, non-focal     Impression & Recommendations:  Problem # 1:  OTHER CHRONIC SINUSITIS (ICD-473.8) Assessment Deteriorated ongoing sinusitis  plan avelox x 5days pulse prednisone  Problem # 2:  COPD (ICD-496) Assessment: Deteriorated copd flare with sinusitis plan see assesment number one  Medications Added to Medication List This Visit: 1)  Prednisone 10 Mg Tabs (Prednisone) .... Take as directed 4 each am x3days, 3 x 3days, 2 x 3days, 1  x 3days then stop 2)  Avelox 400 Mg Tabs (Moxifloxacin hcl) .... By mouth daily  Complete Medication List: 1)  Vytorin 10-80 Mg Tabs (Ezetimibe-simvastatin) .... Take 1 by mouth at bedtime 2)  Flomax 0.4 Mg Cp24 (Tamsulosin hcl) .... One by mouth daily 3)  Diovan Hct 320-25 Mg Tabs (Valsartan-hydrochlorothiazide) .... Take 1 by mouth once daily 4)  Metformin Hcl 500 Mg Xr24h-tab (Metformin hcl) .... Once daily 5)  Advair Diskus 500-50 Mcg/dose Misc (Fluticasone-salmeterol) .... Inhale 1 puff two times a day 6)  Spiriva Handihaler 18 Mcg Caps (Tiotropium bromide monohydrate) .... Inhale contents of 1 capsule once a day 7)  Singulair 10 Mg Tabs (Montelukast sodium) .... Take 1 tablet by mouth once a day 8)  Ventolin Hfa 108 (90 Base) Mcg/act Aers (Albuterol sulfate) .... Inhale 2 puffs every four hours as needed 9)  Prednisone 10 Mg Tabs (Prednisone) .... Take as directed 4 each am x3days, 3 x 3days, 2 x 3days, 1 x 3days then stop 10)  Avelox  400 Mg Tabs (Moxifloxacin hcl) .... By mouth daily  Other Orders: Est. Patient Level IV (16109)  Patient Instructions: 1)  Avelox one daily until gone (use samples) 2)  Prednisone 10mg  4 each am x3days, 3 x 3days, 2 x 3days, 1 x 3days then stop  (sent to pharmacy) 3)  No other medication changes 4)  Return to Work  __1___ week on 07/20/10 5)  Return for recheck in one week and then will give flu vaccine Prescriptions: PREDNISONE 10 MG  TABS (PREDNISONE) Take as directed 4 each am x3days, 3 x 3days, 2 x 3days, 1 x 3days then stop  #30 x 0   Entered and Authorized by:   Storm Frisk MD   Signed by:   Storm Frisk MD on 07/14/2010   Method used:   Electronically to        [facility] [phone]* (retail)       [address] [place], [place]  [address]       Ph: [phone]       Fax: [phone]   RxID:   [id number]

## 2010-11-24 NOTE — Letter (Signed)
Summary: Out of Work  [facility]  [address]   [place], [place] [address]   Phone: [phone]  Fax: [phone]    07/14/2010  TO: Leodis Sias IT MAY CONCERN  RE: MAISEN KLINGLER Divito [address] [place]       The above named individual is currently under my care and will be out of work    FROM: 07/12/2010   THROUGH: 07/20/10    REASON: Asthma exacerbation    MAY RETURN ON: 07/20/10     If you have any further questions or need additional information, please call.     Sincerely,   Shan Levans, MD

## 2010-11-24 NOTE — Letter (Signed)
Summary: CMN for Nebulizer/[facility]  CMN for Nebulizer/[facility]   Imported By: Sherian Rein 06/16/2010 10:20:41  _____________________________________________________________________  External Attachment:    Type:   Image     Comment:   External Document

## 2010-11-24 NOTE — Assessment & Plan Note (Signed)
Summary: Pulmonary OV   Primary Provider/Referring Provider:  Dr Romero Belling  CC:  4 wk follow up.  Pt states cough was better while on abx and prednisone but approx 4 days after finishing meds cough returned.  Cough is prod with green, white, and or brown mucus and it is worse in the evenings.  Pt also states he will occ feel like he "can't breath" when laying down.  Denies wheezing and chest tightness.  Marland Kitchen  History of Present Illness:  65  years old male with known history of COPD and asthmatic bronchitis   March 31, 2010 --Presents for an acute office visit. Complains of prod cough with thick brown mucus over last 3-4 weeks . Worse for last week, keeping him up at night. Denies chest pain, dyspnea, orthopnea, hemoptysis, fever, n/v/d, edema, headache,recent travel or antibiotics.  Uisng nyquil wihtout relief.    May 12, 2010 9:21 AM The pt was ok but every night will choke up and mucus at night.  The mucus was  white and green and now is brown.  This started 3-4 days after TP saw the pt on 03/31/10.  Has a cold all the time.  Dyspnea is ok, just cough.  Not much nasal drip.  No sinus pressure or headaches.  No smoking.  The pt is now working in [place] area.  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  No actual dust.  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     Preventive Screening-Counseling & Management  Alcohol-Tobacco     Smoking Status: quit > 6 months     Year Quit: 1968     Pack years: 59yrs, 1ppd  Current Medications (verified): 1)  Vytorin 10-80 Mg  Tabs (Ezetimibe-Simvastatin) .... Take 1 By Mouth At Bedtime 2)  Flomax 0.4 Mg  Cp24 (Tamsulosin Hcl) .... One By Mouth Daily 3)  Diovan Hct 320-25 Mg  Tabs (Valsartan-Hydrochlorothiazide) .... Take 1 By Mouth Once Daily 4)  Robaxin-750 750 Mg  Tabs (Methocarbamol) .... Take 1 Tablet By Mouth Once A Day As Needed For Cramps 5)  Metformin Hcl 500 Mg Xr24h-Tab (Metformin Hcl) .... Once Daily 6)  Advair  Diskus 500-50 Mcg/dose Misc (Fluticasone-Salmeterol) .... Inhale 1 Puff Two Times A Day 7)  Spiriva Handihaler 18 Mcg Caps (Tiotropium Bromide Monohydrate) .... Inhale Contents of 1 Capsule Once A Day 8)  Singulair 10 Mg Tabs (Montelukast Sodium) .... Take 1 Tablet By Mouth Once A Day 9)  Ventolin Hfa 108 (90 Base) Mcg/act Aers (Albuterol Sulfate) .... Inhale 2 Puffs Every Four Hours As Needed  Allergies (verified): No Known Drug Allergies  Past History:  Past medical, surgical, family and social histories (including risk factors) reviewed, and no changes noted (except as noted below).  Past Medical History: Reviewed history from 07/15/2008 and no changes required. THIGH PAIN (ICD-729.5) UNSPECIFIED ANEMIA (ICD-285.9) BRONCHITIS, OBSTRUCTIVE CHRONIC, WITH EXACERBATION (ICD-491.21) HYPERTENSION (ICD-401.9) HYPERLIPIDEMIA (ICD-272.4) GERD (ICD-530.81) DIABETES MELLITUS, TYPE II (ICD-250.00) COPD (ICD-496) COLONIC POLYPS, HX OF (ICD-V12.72)    Past Surgical History: Reviewed history from 06/05/2007 and no changes required. EGD (04/25/2002  Family History: Reviewed history from 10/02/2007 and no changes required. Peptic ulcer dz father with throat CA  Social History: Reviewed history from 05/05/2009 and no changes required. former smoker, x10-53yrs, less than 1ppd married 5 children drinks some whiskey on the weekends drinks 2 cups caffeine a day states no exercise  Review of Systems  The patient complains of shortness of breath with activity, productive cough, non-productive cough, nasal congestion/difficulty breathing through nose, and change in color of mucus.  The patient denies shortness of breath at rest, coughing up blood, chest pain, irregular heartbeats, acid heartburn, indigestion, loss of appetite, weight change, abdominal pain, difficulty swallowing, sore throat, tooth/dental problems, headaches, sneezing, itching, ear ache, anxiety, depression, hand/feet  swelling, joint stiffness or pain, rash, and fever.    Vital Signs:  Patient profile:   65 year old male Height:      66 inches Weight:      224.38 pounds BMI:     36.35 O2 Sat:      95 % on Room air Temp:     98.8 degrees F oral Pulse rate:   95 / minute BP sitting:   148 / 78  (left arm) Cuff size:   regular  Vitals Entered By: Gweneth Dimitri RN (May 12, 2010 9:22 AM)  O2 Flow:  Room air CC: 4 wk follow up.  Pt states cough was better while on abx and prednisone but approx 4 days after finishing meds cough returned.  Cough is prod with green, white, or brown mucus and it is worse in the evenings.  Pt also states he will occ feel like he "can't breath" when laying down.  Denies wheezing and chest tightness.   Comments Medications reviewed with patient Daytime contact number verified with patient. Gweneth Dimitri RN  May 12, 2010 9:22 AM    Physical Exam  Additional Exam:  Gen: Pleasant, well-nourished, in no distress , normal affect ENT: no lesions, purulent nares bilaterally persists Neck: No JVD, no TMG, no carotid bruits Lungs: No use of accessory muscles, no dullness to percussion, insp/exp wheeze, scattered rhonchi Cardiovascular: RRR, heart sounds normal, no murmurs or gallops, no peripheral edema Abdomen: soft and non-tender, no HSM, BS normal Musculoskeletal: No deformities, no cyanosis or clubbing Neuro: alert, non-focal     Impression & Recommendations:  Problem # 1:  COPD (ICD-496) Assessment Deteriorated COPD with AB flare and acute tracheobronchitis and sinusitis plan pulse prednisone depomedrol 120mg  IM levaquin 750mg /d for 7 days cont inhaled meds  Medications Added to Medication List This Visit: 1)  Prednisone 10 Mg Tabs (Prednisone) .... Take as directed 4 each am x 4 days, 3 x 4 days, 2 x 4 days, then one a day until bottle empty 2)  Levaquin 750 Mg Tabs (Levofloxacin) .... One tablet by mouth daily  Complete Medication List: 1)  Vytorin 10-80  Mg Tabs (Ezetimibe-simvastatin) .... Take 1 by mouth at bedtime 2)  Flomax 0.4 Mg Cp24 (Tamsulosin hcl) .... One by mouth daily 3)  Diovan Hct 320-25 Mg Tabs (Valsartan-hydrochlorothiazide) .... Take 1 by mouth once daily 4)  Robaxin-750 750 Mg Tabs (Methocarbamol) .... Take 1 tablet by mouth once a day as needed for cramps 5)  Metformin Hcl 500 Mg Xr24h-tab (Metformin hcl) .... Once daily 6)  Advair Diskus 500-50 Mcg/dose Misc (Fluticasone-salmeterol) .... Inhale 1 puff two times a day 7)  Spiriva Handihaler 18 Mcg Caps (Tiotropium bromide monohydrate) .... Inhale contents of 1 capsule once a day 8)  Singulair 10 Mg Tabs (Montelukast sodium) .... Take 1 tablet by mouth once a day 9)  Ventolin Hfa 108 (90 Base) Mcg/act Aers (Albuterol sulfate) .... Inhale 2 puffs every four hours as needed 10)  Prednisone 10 Mg Tabs (Prednisone) .... Take as directed 4 each am x 4 days, 3 x 4 days, 2 x 4 days,  then one a day until bottle empty 11)  Levaquin 750 Mg Tabs (Levofloxacin) .... One tablet by mouth daily  Other Orders: Est. Patient Level IV (46962) Prescription Created Electronically [phone]) Depo- Medrol 40mg  (J1030) Depo- Medrol 80mg  (J1040) Admin of Therapeutic Inj  intramuscular or subcutaneous (13244)  Patient Instructions: 1)  Levaquin one daily for 7days 2)  Prednisone 10mg  4 each am x 4 days, 3 x 4 days, 2 x 4 days,  then one a day until bottle empty 3)  No change in inhaled meds 4)  A depomedrol injection will be given 120mg  IM 5)  Return 1 month  Prescriptions: LEVAQUIN 750 MG  TABS (LEVOFLOXACIN) One tablet by mouth daily  #7 x 0   Entered and Authorized by:   Storm Frisk MD   Signed by:   Storm Frisk MD on 05/12/2010   Method used:   Electronically to        [facility] [phone]* (retail)       [address] [place], [place]  [address]       Ph: [phone]       Fax: [phone]   RxID:   [id number] PREDNISONE 10 MG  TABS (PREDNISONE) Take as directed 4  each am x 4 days, 3 x 4 days, 2 x 4 days, then one a day until bottle empty  #45 x 0   Entered and Authorized by:   Storm Frisk MD   Signed by:   Storm Frisk MD on 05/12/2010   Method used:   Electronically to        [facility] [phone]* (retail)       [address] [place], [place]  [address]       Ph: [phone]       Fax: [phone]   RxID:   [id number]    Medication Administration  Injection # 1:    Medication: Depo- Medrol 40mg     Diagnosis: COPD (ICD-496)    Route: IM    Site: LUOQ gluteus    Exp Date: 01/2013    Lot #: [id number]    Mfr: [employer]    Patient tolerated injection without complications    Given by: Gweneth Dimitri RN (May 12, 2010 9:47 AM)  Injection # 2:    Medication: Depo- Medrol 80mg     Diagnosis: COPD (ICD-496)    Route: IM    Site: LUOQ gluteus    Exp Date: 01/2013    Lot #: [id number]    Mfr: [employer]    Patient tolerated injection without complications    Given by: Gweneth Dimitri RN (May 12, 2010 9:47 AM)  Orders Added: 1)  Est. Patient Level IV [94854] 2)  Prescription Created Electronically [[id number]] 3)  Depo- Medrol 40mg  [J1030] 4)  Depo- Medrol 80mg  [J1040] 5)  Admin of Therapeutic Inj  intramuscular or subcutaneous [62703]

## 2010-12-17 ENCOUNTER — Encounter: Payer: Self-pay | Admitting: Critical Care Medicine

## 2010-12-17 ENCOUNTER — Ambulatory Visit (INDEPENDENT_AMBULATORY_CARE_PROVIDER_SITE_OTHER): Payer: BC Managed Care – PPO | Admitting: Critical Care Medicine

## 2010-12-17 DIAGNOSIS — J441 Chronic obstructive pulmonary disease with (acute) exacerbation: Secondary | ICD-10-CM

## 2010-12-17 DIAGNOSIS — J328 Other chronic sinusitis: Secondary | ICD-10-CM

## 2010-12-17 DIAGNOSIS — R05 Cough: Secondary | ICD-10-CM

## 2010-12-17 DIAGNOSIS — R059 Cough, unspecified: Secondary | ICD-10-CM

## 2010-12-22 NOTE — Assessment & Plan Note (Signed)
Summary: Pulmonary OV   Primary Provider/Referring Provider:  Dr Romero Belling  CC:  Acute Viist.  Chest congestion, prod cough at night with thick, brown mucus, increased SOB with exertion at times x 2 wks.  Denies wheezing, chest tightness, and f/c/s.[name]  History of Present Illness:  65  years old male with known history of COPD and asthmatic bronchitis   March 31, 2010 --Presents for an acute office visit. Complains of prod cough with thick brown mucus over last 3-4 weeks . Worse for last week, keeping him up at night. Denies chest pain, dyspnea, orthopnea, hemoptysis, fever, n/v/d, edema, headache,recent travel or antibiotics.  Uisng nyquil wihtout relief.    May 12, 2010 9:21 AM The pt was ok but every night will choke up and mucus at night.  The mucus was  white and green and now is brown.  This started 3-4 days after TP saw the pt on 03/31/10.  Has a cold all the time.  Dyspnea is ok, just cough.  Not much nasal drip.  No sinus pressure or headaches.  No smoking.  The pt is now working in [place] area.  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  No actual dust.  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    July 14, 2010 2:10 PM  Pt is worse with more cough especially at night.  Has to use nebulizer at night. Worse in damp weather.  No sinus drip.  No real chest pain ,  coughing up thick brown mucus more wheezing is noted.  Notes more heartburn.    July 24, 2010 3:46 PM Doing better.  Not as much cough and is at night and is dry.  Not using nebulizer now.  No chest pain. Wheezing is less.  Not as much brown mucus.  December 17, 2010 11:35 AM Noting a URI two weeks ago, now into the chest and coughing up thick brown mucus.  No real chest pain.  No fever.  Notes more dyspnea.  No edema in feet.  Pt notes his cough is worse at night .  Pt notes more dyspnea and notes facial pain.    Current Medications (verified): 1)  Vytorin 10-80 Mg  Tabs  (Ezetimibe-Simvastatin) .... Take 1 By Mouth At Bedtime 2)  Flomax 0.4 Mg  Cp24 (Tamsulosin Hcl) .... One By Mouth Daily 3)  Diovan Hct 320-25 Mg  Tabs (Valsartan-Hydrochlorothiazide) .... Take 1 By Mouth Once Daily 4)  Metformin Hcl 500 Mg Xr24h-Tab (Metformin Hcl) .... Once Daily 5)  Advair Diskus 500-50 Mcg/dose Misc (Fluticasone-Salmeterol) .... Inhale 1 Puff Two Times A Day 6)  Spiriva Handihaler 18 Mcg Caps (Tiotropium Bromide Monohydrate) .... Inhale Contents of 1 Capsule Once A Day 7)  Singulair 10 Mg Tabs (Montelukast Sodium) .... Take 1 Tablet By Mouth Once A Day 8)  Ventolin Hfa 108 (90 Base) Mcg/act Aers (Albuterol Sulfate) .... Inhale 2 Puffs Every Four Hours As Needed 9)  Promethazine-Codeine 6.25-10 Mg/31ml Syrp (Promethazine-Codeine) .... 5 Ml By Mouth Every 6 Hours As Needed Cough 10)  Nyquil 60-7.03-23-999 Mg/32ml Liqd (Pseudoeph-Doxylamine-Dm-Apap) .... As Needed  Allergies (verified): No Known Drug Allergies  Past History:  Past medical, surgical, family and social histories (including risk factors) reviewed, and no changes noted (except as noted below).  Past Medical History: Reviewed history from 07/15/2008 and no changes required. THIGH PAIN (ICD-729.5) UNSPECIFIED ANEMIA (ICD-285.9) BRONCHITIS, OBSTRUCTIVE CHRONIC, WITH EXACERBATION (ICD-491.21) HYPERTENSION (ICD-401.9) HYPERLIPIDEMIA (ICD-272.4) GERD (ICD-530.81) DIABETES MELLITUS,  TYPE II (ICD-250.00) COPD (ICD-496) COLONIC POLYPS, HX OF (ICD-V12.72)    Past Surgical History: Reviewed history from 06/05/2007 and no changes required. EGD (04/25/2002  Family History: Reviewed history from 10/02/2007 and no changes required. Peptic ulcer dz father with throat CA  Social History: Reviewed history from 07/14/2010 and no changes required. former smoker, x10-65yrs, less than 1ppd.  Quit in 1970s. married 5 children drinks some whiskey on the weekends drinks 2 cups caffeine a day states no  exercise  Review of Systems       The patient complains of shortness of breath with activity, shortness of breath at rest, productive cough, and change in color of mucus.  The patient denies non-productive cough, coughing up blood, chest pain, irregular heartbeats, acid heartburn, indigestion, loss of appetite, weight change, abdominal pain, difficulty swallowing, sore throat, tooth/dental problems, headaches, nasal congestion/difficulty breathing through nose, sneezing, itching, ear ache, anxiety, depression, hand/feet swelling, joint stiffness or pain, rash, and fever.    Vital Signs:  Patient profile:   65 year old male Height:      67 inches Weight:      222 pounds BMI:     34.90 O2 Sat:      95 % on Room air Temp:     98.4 degrees F oral Pulse rate:   75 / minute BP sitting:   120 / 78  (left arm) Cuff size:   large  Vitals Entered By: Gweneth Dimitri RN (December 17, 2010 11:32 AM)  O2 Flow:  Room air CC: Acute Viist.  Chest congestion, prod cough at night with thick, brown mucus, increased SOB with exertion at times x 2 wks.  Denies wheezing, chest tightness, f/c/s. Comments Medications reviewed with patient Daytime contact number verified with patient. Gweneth Dimitri RN  December 17, 2010 11:32 AM    Physical Exam  Additional Exam:  Gen: Pleasant, well-nourished, in no distress , normal affect ENT: no lesions, purulent nares bilaterally persists Neck: No JVD, no TMG, no carotid bruits Lungs: No use of accessory muscles, no dullness to percussion, increased  wheeze and rhonchi Cardiovascular: RRR, heart sounds normal, no murmurs or gallops, no peripheral edema Abdomen: soft and non-tender, no HSM, BS normal Musculoskeletal: No deformities, no cyanosis or clubbing Neuro: alert, non-focal     Impression & Recommendations:  Problem # 1:  BRONCHITIS, OBSTRUCTIVE CHRONIC, WITH EXACERBATION (ICD-491.21) Assessment Deteriorated acute tracheobronchitis with  flare plan Augmentin (generic) one twice daily for 7days  Prednisone 10mg  Take 4 daily for two days, then 3 daily for two days, then two daily for two days then one daily for two days then stop Cough syrup refill given No other medication changes Return 4 months, sooner if unimproved  Medications Added to Medication List This Visit: 1)  Ventolin Hfa 108 (90 Base) Mcg/act Aers (Albuterol sulfate) .... Inhale 2 puffs every four hours as needed 2)  Promethazine-codeine 6.25-10 Mg/67ml Syrp (Promethazine-codeine) .... 5 ml by mouth every 6 hours as needed cough 3)  Nyquil 60-7.03-23-999 Mg/63ml Liqd (Pseudoeph-doxylamine-dm-apap) .... As needed 4)  Augmentin 875-125 Mg Tabs (Amoxicillin-pot clavulanate) .... By mouth twice daily generic please 5)  Prednisone 10 Mg Tabs (Prednisone) .... Take as directed take 4 daily for two days, then 3 daily for two days, then two daily for two days then one daily for two days then stop  Complete Medication List: 1)  Vytorin 10-80 Mg Tabs (Ezetimibe-simvastatin) .... Take 1 by mouth at bedtime 2)  Flomax 0.4 Mg Cp24 (Tamsulosin hcl) .[name] KitchenMarland KitchenMarland [name]  One by mouth daily 3)  Diovan Hct 320-25 Mg Tabs (Valsartan-hydrochlorothiazide) .... Take 1 by mouth once daily 4)  Metformin Hcl 500 Mg Xr24h-tab (Metformin hcl) .... Once daily 5)  Advair Diskus 500-50 Mcg/dose Misc (Fluticasone-salmeterol) .... Inhale 1 puff two times a day 6)  Spiriva Handihaler 18 Mcg Caps (Tiotropium bromide monohydrate) .... Inhale contents of 1 capsule once a day 7)  Singulair 10 Mg Tabs (Montelukast sodium) .... Take 1 tablet by mouth once a day 8)  Ventolin Hfa 108 (90 Base) Mcg/act Aers (Albuterol sulfate) .... Inhale 2 puffs every four hours as needed 9)  Promethazine-codeine 6.25-10 Mg/51ml Syrp (Promethazine-codeine) .... 5 ml by mouth every 6 hours as needed cough 10)  Augmentin 875-125 Mg Tabs (Amoxicillin-pot clavulanate) .... By mouth twice daily generic please 11)  Prednisone 10 Mg Tabs  (Prednisone) .... Take as directed take 4 daily for two days, then 3 daily for two days, then two daily for two days then one daily for two days then stop  Other Orders: Est. Patient Level IV (16109)  Patient Instructions: 1)  Augmentin (generic) one twice daily for 7days  2)  Prednisone 10mg  Take 4 daily for two days, then 3 daily for two days, then two daily for two days then one daily for two days then stop 3)  Cough syrup refill given 4)  No other medication changes 5)  Return 4 months, sooner if unimproved Prescriptions: PREDNISONE 10 MG  TABS (PREDNISONE) Take as directed Take 4 daily for two days, then 3 daily for two days, then two daily for two days then one daily for two days then stop  #20 x 0   Entered and Authorized by:   Storm Frisk MD   Signed by:   Storm Frisk MD on 12/17/2010   Method used:   Electronically to        [facility] [phone]* (retail)       [address] [place], [place]  [address]       Ph: [phone]       Fax: [phone]   RxID:   [id number] AUGMENTIN 875-125 MG  TABS (AMOXICILLIN-POT CLAVULANATE) By mouth twice daily generic please  #14 x 0   Entered and Authorized by:   Storm Frisk MD   Signed by:   Storm Frisk MD on 12/17/2010   Method used:   Electronically to        [facility] [phone]* (retail)       [address] [place], [place]  [address]       Ph: [phone]       Fax: [phone]   RxID:   [id number] PROMETHAZINE-CODEINE 6.25-10 MG/5ML SYRP (PROMETHAZINE-CODEINE) 5 ML by mouth every 6 hours as needed cough  #240 ml x 1   Entered and Authorized by:   Storm Frisk MD   Signed by:   Storm Frisk MD on 12/17/2010   Method used:   Print then Give to Patient   RxID:   [id number] VENTOLIN HFA 108 (90 BASE) MCG/ACT AERS (ALBUTEROL SULFATE) Inhale 2 puffs every four hours as needed  #1 x 6   Entered and Authorized by:   Storm Frisk MD   Signed by:   Storm Frisk MD on  12/17/2010   Method used:   Electronically to        [facility] [address] [phone]* (retail)       [address] [place], [place]  [address]       Ph: [phone]       Fax: [phone]   RxID:   [id number] SPIRIVA HANDIHALER 18 MCG CAPS (TIOTROPIUM BROMIDE MONOHYDRATE) Inhale contents of 1 capsule once a day  #30 x 6   Entered and Authorized by:   Storm Frisk MD   Signed by:   Storm Frisk MD on 12/17/2010   Method used:   Electronically to        [facility] [phone]* (retail)       [address] [place], [place]  [address]       Ph: [phone]       Fax: [phone]   RxID:   [id number] ADVAIR DISKUS 500-50 MCG/DOSE MISC (FLUTICASONE-SALMETEROL) Inhale 1 puff two times a day  #1 x 6   Entered and Authorized by:   Storm Frisk MD   Signed by:   Storm Frisk MD on 12/17/2010   Method used:   Electronically to        [facility] [phone]* (retail)       [address] [place], [place]  [address]       Ph: [phone]       Fax: [phone]   RxID:   [id number]

## 2011-02-25 ENCOUNTER — Other Ambulatory Visit: Payer: Self-pay | Admitting: Critical Care Medicine

## 2011-02-25 MED ORDER — MONTELUKAST SODIUM 10 MG PO TABS: 10.0000 mg | ORAL_TABLET | Freq: Every day | ORAL | Status: DC

## 2011-02-25 MED ORDER — TIOTROPIUM BROMIDE MONOHYDRATE 18 MCG IN CAPS: 18.0000 ug | ORAL_CAPSULE | Freq: Every day | RESPIRATORY_TRACT | Status: DC

## 2011-02-25 MED ORDER — FLUTICASONE-SALMETEROL 500-50 MCG/DOSE IN AEPB: 1.0000 | INHALATION_SPRAY | Freq: Two times a day (BID) | RESPIRATORY_TRACT | Status: DC

## 2011-02-25 NOTE — Telephone Encounter (Signed)
Pt is needing 90 day supply sent ot [facility] in [place]. He is aware we will send rx for Advair, Spiriva and Singulair but he will need to call his PCP for the other prescriptions he is needing. Pt verbalized understanding.

## 2011-03-09 NOTE — Assessment & Plan Note (Signed)
[name]                             PULMONARY OFFICE NOTE   NAME:HOPKINSCarrel, [name]                  MRN:          [id number]  DATE:05/30/2007                            DOB:          06-Mar-1946    HISTORY OF PRESENT ILLNESS:  The patient is 65 -year-old [place]  American male patient of Dr.  Lynelle [name] who has a known history of  chronic asthmatic bronchitis with a significant atopic features who  presents today for an acute office visit. The patient complains of a  three week history of progressively worsening productive cough with  thick yellowish-brown sputum, wheezing. Symptoms have been on-and-off  for the last three weeks, but have been significantly increased over the  last four days. The patient denies any hemoptysis, orthopnea, PND or leg  swelling.   PAST MEDICAL HISTORY:  Reviewed.   CURRENT MEDICATIONS:  Reviewed.   PHYSICAL EXAMINATION:  The patient is pleasant male in no acute  distress. He is afebrile with stable vital signs. O2 saturation is 93%  on room air.  HEENT: Nasal mucosa is with moderate erythema. Nontender sinuses.  Posterior pharynx is clear  NECK: Supple without cervical adenopathy. No JVD.  LUNGS: Lung sounds reveal coarse breath sounds bilaterally with a few  expiratory wheezes.  CARDIAC: Regular rate and rhythm.  ABDOMEN: Soft and nontender.  EXTREMITIES: Warm without any edema.   IMPRESSION/PLAN:  Acute asthmatic bronchitic exacerbation. The patient  began Omnicef x7 days, Mucinex DM twice daily, prednisone taper over the  next week. May use Tussionex one teaspoon every 12 hours as needed for  cough control. The patient will return back with Dr.  Delford [name] in two  weeks or sooner if needed.      [name] Oaks, NP  Electronically Signed      [name] Cradle [name] Field, MD, [facility]  Electronically Signed   TP/MedQ  DD: 05/31/2007  DT: 05/31/2007  Job #: [id number]

## 2011-03-09 NOTE — Assessment & Plan Note (Signed)
[facility]                             PULMONARY OFFICE NOTE   NAME:HOPKINSStratton, Villwock                  MRN:          [id number]  DATE:07/03/2007                            DOB:          [birth year]    Mr. Pollino, a 65 year old African-American male, history of chronic  obstructive lung disease, asthmatic bronchitis, still coughing some  thick, brown material and burning in the chest and fatigue, maintains  Advair 500/50, 1 spray b.i.d., Spiriva daily, Singulair daily 10 mg,  Protonix 40 mg daily.   EXAM:  Temp 97, blood pressure 140/90, pulse 81, saturation 94% room  air.  CHEST:  Diminished breath sounds with prolonged expiratory phase, no  wheeze or rhonchi noted.  CARDIAC:  Regular rate and rhythm without S3, normal S1 and S2.  ABDOMEN:  Soft, nontender.  EXTREMITIES:  No edema or clubbing.   IMPRESSION:  Asthmatic bronchitis with mild flare.   PLAN:  The patient received pulse prednisone 40 mg daily with rapid  taper, and he will also receive Augmentin 875 mg twice daily x7 days.  Refills were given on his inhalers.  We will see the patient back in  followup in 6 weeks.     Charlcie Cradle Delford Field, MD, [facility]  Electronically Signed    PEW/MedQ  DD: 07/03/2007  DT: 07/03/2007  Job #: [id number]   cc:   [name], MD

## 2011-03-12 NOTE — Discharge Summary (Signed)
NAME:  [name], [name]                     ACCOUNT NO.:  [id number]   MEDICAL RECORD NO.:  [id number]                   PATIENT TYPE:  INP   LOCATION:  0467                                 FACILITY:  [facility]   PHYSICIAN:  Charlaine Dalton. Sherene Sires, M.D. [facility]           DATE OF BIRTH:  January 30, 1946   DATE OF ADMISSION:  03/10/2003  DATE OF DISCHARGE:  03/13/2003                                 DISCHARGE SUMMARY   FINAL DIAGNOSES:  1. Acute exacerbation of asthmatic bronchitis, complicated by acute     respiratory distress and hypercarbic respiratory failure on admission.     a. A normal chest x-ray and minimal sinus thickening documented this        admission by sinus CT scan.  2. Coughing disproportionate to wheezing, consistent with reflux in a     patient with documented Barrett's esophagitis.  3. Non insulin-dependent diabetes mellitus with a flare-up secondary to     steroid use.  4. Hypertension.   HISTORY OF PRESENT ILLNESS:  Please see dictated H&P.  This patient had been  having increasing symptoms of wheezing and coughing for four days prior to  admission, and came to the emergency room with what was felt to be a  respiratory extremist.  He did not improve with aggressive treatment in the  emergency room, and therefore is admitted by Dr. Delford Field for inpatient  treatment.  Although his wheezing improved, his cough remained refractory,  and the chart indicated the patient had a history of Barrett's esophagitis,  presumably due to reflux, and therefore reflux medications were maximized  with the increase of proton pump inhibitor to b.i.d. and addition of Reglan.  Even this did not control his cough, even after his wheezing was almost  gone.  Therefore, Ultram was added to the regimen along with guiafenesin DM  which seemed to control the cough without the need for narcotics.   The other issue addressed during hospitalization is that the patient does  not know his medications and was  unsure of the details regarding his  medications.  I have asked him therefore to return to the office in one week  with all of his medications in hand so that we can correlate what he thinks  he is supposed to be taking against what we have recorded in the office as  taking.   He is therefore being discharged in improved condition with no wheezing.  He  continues to cough during forced expiratory maneuvers, but not during  purselip maneuver with no wheeze at all audible and able to lay flat with no  desaturation noted.   DIET:  He should maintain a low salt, low fat diet.   FOLLOWUP:  Be seen in the office in one week with no work until he is seen  in the office.   DISCHARGE MEDICATIONS:  1. Diovan 160/12.5 mg daily.  2. Clomid one daily.  3. Flomax 0.4 mg one  daily.  4. Singular 10 mg one q.p.m.  5. Lipitor 10 mg one q.p.m.  6. Prevacid 30 mg b.i.d.  7. Glucophage 500 mg one b.i.d.  8. Reglan before meals and at bedtime.  9. Avalox 4 mg daily x4 days empirically then stop.  10.      Prednisone 40 mg daily to be tapered down to 10 mg daily by time he     is seen in the office in a week.  11.      Guiafenesin DM two b.i.d.  If still coughing, can use Tylenol No. 3     one q.4h.  12.      Ultram 50 mg one q.i.d.  13.      If wheezing, he has a nebulizer to use at home, albuterol 2.5 mg     and Atrovent 0.5 mg q.4h. p.r.n.                                               Charlaine Dalton. Sherene Sires, M.D. [facility]    MBW/MEDQ  D:  03/13/2003  T:  03/13/2003  Job:  [id number]

## 2011-03-12 NOTE — Assessment & Plan Note (Signed)
[facility]                               PULMONARY OFFICE NOTE   NAME:HOPKINSRivaldo, [name]                  MRN:          [id number]  DATE:08/11/2006                            DOB:          05/27/1946    HISTORY OF PRESENT ILLNESS:  The patient is a 65 year old African-American  male patient of Dr. Delford Field with a known history of asthmatic bronchitis and  COPD.  He presented with a 5-day history of productive cough with thick  yellow-green mucus, shortness of breath, and wheezing. The patient denies  any hemoptysis, orthopnea, PND, or leg swelling.   PAST MEDICAL HISTORY:  Is reviewed.   CURRENT MEDICATIONS:  Reviewed.   PHYSICAL EXAMINATION:  GENERAL: The patient is a pleasant black male in no  acute distress.  VITAL SIGNS: Temperature 99.5, blood pressure 142/84, saturation 96% on room  air.  HEENT:  Unremarkable.  NECK:  Supple without cervical adenopathy and no jugular venous distention.  LUNGS:  Coarse breath sounds bilaterally with a few expiratory wheezes.  CARDIAC: Regular rate and rhythm.  ABDOMEN: Soft, nontender, nondistended.  EXTREMITIES:  Warm without any edema.   IMPRESSION AND PLAN:  Acute exacerbation of chronic obstructive pulmonary  disease and asthmatic bronchitis.  The patient will begin Levaquin 750 mg x5  days, prednisone taper over the next week and __________ as needed for  cough.  Mucinex DM twice daily.  The patient will return here in 2 weeks  with Dr. Delford Field or sooner if needed.      ______________________________  Rubye Oaks, NP    ______________________________  Charlcie Cradle. Delford Field, MD, FCCP     TP/MedQ  DD:  08/11/2006  DT:  08/12/2006  Job #:  [id number]

## 2011-03-12 NOTE — H&P (Signed)
[facility]  Patient:    [name], TAT Visit Number: [id number] MRN: [id number]          Service Type: Attending:  Charlcie Cradle. Delford Field, M.D. [facility] Dictated by:   Charlcie Cradle. Delford Field, M.D. [facility]                           History and Physical  PREVIOUS [place] MEDICAL RECORD NUMBER:  [id number].  LOCATION:  Intensive care unit at [facility].  CHIEF COMPLAINT:  Respiratory distress.  HISTORY OF PRESENT ILLNESS:  This is a 65 year old African-American male with a history of chronic asthmatic bronchitis who was last admitted to the hospital in 1998. He comes in today with acute respiratory distress, gradually worse over the last three days, increased cough, productive of thick brown material, increased wheezing, increased chest tightness. The patients symptomatology has progressed to the point where he comes in today in ______.  PAST MEDICAL HISTORY:  History of non-insulin-dependent diabetes, history of internal hemorrhoids and colon polyps, history of lipomas, history of hypertension, history of asthma and bronchitis.  ALLERGIES:  ASPIRIN.  CURRENT MEDICATIONS: 1. Singulair 10 mg daily. 2. Diltiazem ER 240 mg daily. 3. Claritin 10 mg daily. 4. Flovent 110 mcg strength, two sprays b.i.d. 5. Atrovent two sprays b.i.d. 6. Albuterol two sprays q.4h. p.r.n. 7. Albuterol via nebulization p.r.n.  REVIEW OF SYSTEMS:  Noncontributory.  FAMILY HISTORY:  Father has history of stroke and hypertension. Mother died in the age of 68s of unknown cause. Two children have asthma, and hypertension and diabetes runs in the family.  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 He smoked until 10 years ago and then quit smoking.  The patient also has a history of duodenal ulcer, and hypercholesterolemia, and alcohol use in the past.  PHYSICAL EXAMINATION:  VITAL SIGNS:  Temperature 97.6,  blood pressure 170/100, pulse 107, saturation 89% on room air.  GENERAL:  This is an ill-appearing African-American male in acute respiratory distress.  CHEST:  Inspiratory and expiratory wheezes with very poor air movement.  CARDIAC:  Regular rate and rhythm without S3, normal S1, S2. Resting sound is tachycardia noted.  ABDOMEN:  Soft, nontender.  EXTREMITIES:  No edema or clubbing.  NEUROLOGIC:  Intact.  HEENT:  Boggy mucous membranes. Oropharynx clear.  NECK:  Supple.  LABORATORY DATA:  None at the time of this dictation.  IMPRESSION:  Status asthmaticus with acute respiratory failure and hypoxemia.  RECOMMENDATIONS:  Plan is for the patient to be admitted to intensive care unit, give IV steroids, give intensive nebulizer treatments, follow blood gas and lab data, and follow up the patient expectantly. Dictated by:   Charlcie Cradle Delford Field, M.D. [facility] Attending:  Charlcie Cradle. Delford Field, M.D. [facility] DD:  07/03/01 TD:  07/03/01 Job: [id number]

## 2011-03-12 NOTE — H&P (Signed)
NAME:  [name], [name] NO.:  [id number]   MEDICAL RECORD NO.:  [id number]                   PATIENT TYPE:  EMS   LOCATION:  ED                                   FACILITY:  [facility]   PHYSICIAN:  Shan Levans, M.D. [facility]            DATE OF BIRTH:  1946-10-17   DATE OF ADMISSION:  03/10/2003  DATE OF DISCHARGE:                                HISTORY & PHYSICAL   CHIEF COMPLAINT:  Asthmatic-bronchitic exacerbation.   HISTORY OF PRESENT ILLNESS:  This is a 65 year old, African-American male  who has been sick for about four days after working in the yard, having  increasing allergic symptomatology.  Patient progressed to the point where  he was brought to the emergency department in status asthmaticus with  hypercarbia, nearly requiring intubation but did break with magnesium and  intensive neb treatments, however, not well enough to be discharged home.  He is admitted for further inpatient care.   PAST MEDICAL HISTORY:  1. Non-insulin-dependent diabetes.  2. History of internal hemorrhoids.  3. Colon polyps.  4. Lipomas.  5. History of hypertension.  6. Asthma.  7. Bronchitis.  8. Hyperlipidemia.   ALLERGIES:  Aspirin.   CURRENT MEDICATIONS:  1. Singulair 10 mg daily.  2. Diovan 160/12-1/2 HCT 1 daily.  3. __________ 1/4, 50 mg, tablet daily.  4. Lipitor 40 mg daily.  5. Glucophage 500 mg b.i.d.  6. Flomax 0.4 mg daily.  7. Prevacid 30 mg daily.  8. Albuterol p.r.n.  9. Flovent 110 mcg strength 2 sprays b.i.d.  10.      Claritin 10 mg daily.  11.      Atrovent 2 sprays b.i.d.   REVIEW OF SYSTEMS:  Noncontributory.   FAMILY HISTORY:  Father had a history of stroke and hypertension.  Mother  died at age 74 of unknown cause.  Two of the patient's children had asthma.  Hypertension and diabetes runs in the family.   SOCIAL HISTORY:  Patient is married and has five children.  Is employed at  [employer], is a Naval architect person.  He  smoked until 10 years ago,  then he quit smoking.  History of duodenal ulcer and hypercholesterolemia as  noted.  Alcohol use in the past.  Currently, off alcohol.   PHYSICAL EXAMINATION:  VITAL SIGNS:  Sat was 87% initially; patient was in  extreme distress on arrival.  On arrival, on 4 L, sat 97%.  Blood pressure  was 104/49, heart rate 90.  Temp is not yet available.  GENERAL:  This is an ill-appearing, African-American male in moderate  respiratory distress.  CHEST:  Showed inspiratory and expiratory wheeze with poor air movement.  CARDIAC:  Showed a regular rate and rhythm without S3.  Normal S1, S2.  ABDOMEN:  Soft, nontender.  EXTREMITIES:  Showed no edema or clubbing or venous disease.  NEUROLOGIC:  Exam was intact.  HEENT:  Showed no jugular venous  distention or lymphadenopathy.  Oropharynx  clear.  NECK:  Supple.   LABORATORY DATA:  The pO2 was 50.  Other labs are pending at the time of  this dictation.  Chest x-ray showed no active process.   IMPRESSION/RECOMMENDATIONS:  Status asthmaticus with asthmatic-bronchitic  exacerbation, with hypoxic and hypercarbic respiratory failure, with  associated acute bronchitis in a patient with diabetes, hypertension.  Recommendation:  Admit to a regular bed, give intensive neb treatments, IV  Solu-Medrol, p.o. Avalox, titrate oxygen.                                               Shan Levans, M.D. [facility]    PW/MEDQ  D:  03/10/2003  T:  03/10/2003  Job:  [id number]   cc:   [name], M.D. [facility]

## 2011-03-12 NOTE — Discharge Summary (Signed)
[facility]  Patient:    [name], [name] Visit Number: [id number] MRN: [id number]          Service Type: MED Location: [id number] Attending Physician:  Caleb Popp Dictated by:   Earley Favor, RN, MSN, ACNP Admit Date:  07/03/2001 Disc. Date: 07/07/01                             Discharge Summary  DATE OF BIRTH:  Mar 09, 1946.  DISCHARGE DIAGNOSES: 1. Asthmatic bronchitis with a flare with status asthmaticus and with    hypoxic respiratory failure. 2. Fluid retention.  HISTORY OF PRESENT ILLNESS:  [name] is a 65 year old white male patient of Dr. Shan [name], who presented to the office July 03, 2001, in acute respiratory distress and required urgent hospitalization being in status asthmaticus.  Due to his acute pulmonary status, he very narrowly avoided being orotracheally intubated.  LABORATORY DATA:  CK-MB 170, CK-MB 1.3, troponin-I 0.01.  BRAIN:  Neuropeptide is 40.7.  Arterial blood gas on 2 liter nasal cannula reveals pH 7.37, pCO2 44, pO2 53. WBCs 6.5, hemoglobin 13.0, hematocrit 38.8, platelets 285.  Sodium 134, potassium 3.7, chloride 105, CO2 27, glucose 118, BUN 13, creatinine 1.1, calcium 9.9.  Respiratory culture showed normal flora.  RADIOGRAPHIC DATA:  Chest CT shows minimal increase in heart size with questionable early vascular congestion.  A 12-lead EKG showed normal sinus rhythm, without acute processes.  A 2-D echocardiogram demonstrates normal preserved left ventricular function.  HOSPITAL COURSE:  #1 - ASTHMATIC BRONCHITIS WITH FLARE:  Status asthmaticus with hypoxic respiratory failure:  [name] was admitted to [facility] Intensive Care Unit.  He was treated with the usual pharmaceutical interventions consisting of high-flow oxygen, IV Solu-Medrol, IV antibiotics. He responded to initial interventions and narrowly avoided being intubated, requiring  mechanical ventilatory support.  He continued to progress with high-dose steroids.  Of note, during the hospitalization to ICU he did have one day when his pulmonary status decreased and required continued high-dose IV steroids.  He reached maximal hospital benefit by July 07, 2001, and was discharged to home in improved condition.  #2 - FLUID RETENTION:  Fluid retention was most likely secondary to steroid use.  Cardiac functions were evaluation with cardiac enzymes, a 2-D echocardiogram, with along with a 12-lead EKG which demonstrated no appreciable cardiac disease or impairment of left ventricular function.  DISCHARGE MEDICATIONS:  1. Singulair 10 mg 1 q.d.  2. Claritin 10 mg 1 q.d.  3. Cardizem 240 mg 1 q.d.  4. Protonix 40 mg 1 q.d.  5. Humibid LA 2 tablets b.i.d.  6. Flovent 220 mcg 2 puffs b.i.d.  7. Atrovent puffer, 2 puffs four times a day.  8. Ceftin 250 mg 1 b.i.d.  9. Albuterol nebulizer p.r.n., 2.5 mg per vial. 10. Prednisone on a taper with 40 mg x 5 days, 30 mg x 5 days, 20 mg x 5 days,     10 mg x 5 days, and then stop.  DIET:  As tolerated.  SPECIAL INSTRUCTIONS:  Call for any problems.  FOLLOWUP:  She will have a followup visit with Earley Favor, N.P., on July 14, 2001, at 2 p.m.  DISPOSITION/CONDITION ON DISCHARGE:  Status asthmaticus has been resolved. Acute hypoxic respiratory failure secondary to asthmatic bronchitic flare has been resolved without requiring orotracheal intubation.  He has returned to his normal pulmonary baseline.  He is  being discharged home in improved condition. Dictated by:   Earley Favor, RN, MSN, ACNP Attending Physician:  Caleb Popp DD:  07/07/01 TD:  07/07/01 Job: [id number]

## 2011-03-12 NOTE — Assessment & Plan Note (Signed)
[facility]                               PULMONARY OFFICE NOTE   NAME:HOPKINSVinton, [name]                  MRN:          [id number]  DATE:08/30/2006                            DOB:          [birth year]    REASON FOR VISIT:  Mr. Wesch returns today in followup.   HISTORY OF PRESENT ILLNESS:  Mr. Reiland is a 65 year old African-American  male with history of severe asthmatic bronchitis, significant atopic  features, chronic obstructive lung disease, positive multiple atopic  features.  He was here in October with a flare, given a course of prednisone  and antibiotics.  He improved a bit and then got worse once the steroids  were stopped. He is now on Advair 500/50, one spray b.i.d., Protonix 40 mg  daily, Singulair 10 mg daily and Spiriva daily.   PHYSICAL EXAMINATION:  VITAL SIGNS:  Temperature 98, blood pressure 130/88,  pulse 86, saturation 92% on room air.  CHEST:  Showed distant breath sounds with prolonged expiratory phase, no  wheeze or rhonchi were noted.  CARDIAC:  Exam showed a regular rate and rhythm without S3.  Normal S1 and  S2.  ABDOMEN:  Soft, nontender.  EXTREMITIES:  Showed no edema or clubbing.  SKIN:  Clear.  NEUROLOGICAL:  Exam was intact.  HEENT:  Showed no jugular venous distention or lymphadenopathy. Oropharynx  clear.  NECK:  Supple.   IMPRESSION:  Asthmatic bronchitic flare, steroid-dependent.   PLAN:  Plan is for patient to receive Augmentin 875 mg b.i.d., pulsed  prednisone, maintain inhaled medicines as currently dosed.  We will check an  IgE and RAST assay.     Charlcie Cradle Delford Field, MD, [facility]  Electronically Signed    PEW/MedQ  DD: 08/30/2006  DT: 08/31/2006  Job #: [id number]

## 2011-03-12 NOTE — Assessment & Plan Note (Signed)
[name]                             PULMONARY OFFICE NOTE   NAME:[name], [name]                  MRN:          [id number]  DATE:12/01/2006                            DOB:          12/09/1945    HISTORY OF PRESENT ILLNESS:  The patient is 65 year old African American  male patient of Dr. Lynelle Doctor who has a known history of chronic  asthmatic bronchitis with significant atopic features who presents for a  1-week history of productive cough with thick yellowish mucus, nasal  congestion, sinus pain and pressure and wheezing. The patient was seen  two months ago with similar symptoms and was given Avalox and a  prednisone taper. The patient reports symptoms did resolve and has been  doing well up until the last week.   The patient is maintained on Advair 500/50 twice daily, Spiriva daily  and Singulair 10 mg daily.   PAST MEDICAL HISTORY:  Reviewed.   CURRENT MEDICATIONS:  Reviewed.   PHYSICAL EXAMINATION:  The patient is a black male in no acute distress.  He is afebrile with stable vital signs. O2 saturation is 94% on room  air.  HEENT:  Nasal mucosa is erythematous and edematous. Maxillary sinus  tenderness to percussion. Posterior pharynx is clear.  NECK: Supple without adenopathy. No JVD.  LUNGS: Lung sounds reveal coarse breath sounds bilaterally with diffuse  expiratory wheezes.  CARDIAC: Regular rate.  ABDOMEN: Morbidly obese and soft.  EXTREMITIES: Warm without any calf cyanosis, clubbing or edema.   IMPRESSION/PLAN:  Acute asthmatic bronchitic exacerbation and sinusitis.  The patient is given Levaquin 750 mg x5 days, Mucinex DM twice daily.  Depo-Medrol 120 IM injection is given also. Prednisone taper over the  next week. The patient is to use saline nasal spray p.r.n. and was given  a Xopenex nebulizer treatment in the office. The patient is to return  back here with Dr. Delford Field in two weeks or sooner if needed.      Rubye Oaks, NP  Electronically Signed      Charlcie Cradle Delford Field, MD, [facility]  Electronically Signed   TP/MedQ  DD: 12/01/2006  DT: 12/01/2006  Job #: [id number]

## 2011-03-12 NOTE — Assessment & Plan Note (Signed)
[facility]                             PULMONARY OFFICE NOTE   NAME:[name], [name]                  MRN:          [id number]  DATE:09/28/2006                            DOB:          12-28-1945    [name] is a 65 year old African-American male with a history of  chronic asthmatic bronchitis with significant atopic features noting  recurrent increased dyspnea for 2 weeks since been off prednisone and  antibiotics.   MEDICATIONS:  1. Advair 500/50 one spray b.i.d.  2. Protonix 40 mg daily.  3. Spiriva daily.  4. Singulair 10 mg daily.   PHYSICAL EXAMINATION:  VITAL SIGNS:  Temperature 98, blood pressure not  obtained, pulse 108, saturation 94% on room air.  CHEST:  Inspiratory and expiratory wheeze with poor air flow.  CARDIAC:  A regular rate and rhythm without S3.  Normal S1 and S2.  ABDOMEN:  Soft, nontender.  EXTREMITIES:  No edema or clubbing.  SKIN:  Clear.  NEUROLOGIC:  Intact.  HEENT:  No jugular venous distention, no lymphadenopathy.  Oropharynx  was clear.  NECK:  Supple.   LABORATORY DATA:  The lab data are reviewed and revealed an IgE level of  170 with positive RAST assay.   PLAN:  Plan is for the patient to be considered for the EXTRA trial  which is Xolair versus placebo.  He will be screened for randomization  in 1 month.  He will be repulsed on prednisone 40 mg a day with a slow  taper, and will see the patient back in followup.     Charlcie Cradle Delford Field, MD, [facility]  Electronically Signed    PEW/MedQ  DD: 09/28/2006  DT: 09/29/2006  Job #: [id number]   cc:   [name], MD

## 2011-03-30 ENCOUNTER — Encounter: Payer: Self-pay | Admitting: Internal Medicine

## 2011-04-01 ENCOUNTER — Ambulatory Visit: Payer: BC Managed Care – PPO | Admitting: Internal Medicine

## 2011-04-01 ENCOUNTER — Ambulatory Visit: Payer: BC Managed Care – PPO | Admitting: Family Medicine

## 2011-04-01 DIAGNOSIS — Z0289 Encounter for other administrative examinations: Secondary | ICD-10-CM

## 2011-05-27 ENCOUNTER — Encounter: Payer: Self-pay | Admitting: Critical Care Medicine

## 2011-05-27 ENCOUNTER — Ambulatory Visit (INDEPENDENT_AMBULATORY_CARE_PROVIDER_SITE_OTHER): Payer: BC Managed Care – PPO | Admitting: Critical Care Medicine

## 2011-05-27 VITALS — BP 120/60 | HR 100 | Temp 98.7°F | Ht 66.0 in | Wt 227.0 lb

## 2011-05-27 DIAGNOSIS — J441 Chronic obstructive pulmonary disease with (acute) exacerbation: Secondary | ICD-10-CM

## 2011-05-27 MED ORDER — PREDNISONE 10 MG PO TABS: ORAL_TABLET | ORAL | Status: DC

## 2011-05-27 MED ORDER — VALACYCLOVIR HCL 500 MG PO TABS: 500.0000 mg | ORAL_TABLET | Freq: Two times a day (BID) | ORAL | Status: AC

## 2011-05-27 MED ORDER — ROFLUMILAST 500 MCG PO TABS: 500.0000 ug | ORAL_TABLET | Freq: Every day | ORAL | Status: DC

## 2011-05-27 MED ORDER — AMOXICILLIN-POT CLAVULANATE 875-125 MG PO TABS: 1.0000 | ORAL_TABLET | Freq: Two times a day (BID) | ORAL | Status: AC

## 2011-05-27 MED ORDER — AMOXICILLIN-POT CLAVULANATE 875-125 MG PO TABS: 1.0000 | ORAL_TABLET | Freq: Two times a day (BID) | ORAL | Status: DC

## 2011-05-27 NOTE — Patient Instructions (Signed)
Augmentin one twice daily for 10days Prednisone 10mg  Take 4 for three days, 3 for three days, 2 for three days, 1 for three days and stop When you finish the above medications, start Daliresp one daily for prevention of future lung flare ups. No other medication changes Return 4 months or as needed

## 2011-05-27 NOTE — Progress Notes (Signed)
  Subjective:    Patient ID: [name], male    DOB: 1946-01-26, 65 y.o.   MRN: [id number]  HPI 65 years old male with known history of COPD and asthmatic bronchitis  March 31, 2010 --Presents for an acute office visit. Complains of prod cough with thick brown mucus over last 3-4 weeks . Worse for last week, keeping him up at night. Denies chest pain, dyspnea, orthopnea, hemoptysis, fever, n/v/d, edema, headache,recent travel or antibiotics.  Uisng nyquil wihtout relief.  May 12, 2010 9:21 AM  The pt was ok but every night will choke up and mucus at night. The mucus was white and green and now is brown. This started 3-4 days after TP saw the pt on 03/31/10. Has a cold all the time. Dyspnea is ok, just cough. Not much nasal drip. No sinus pressure or headaches. No smoking. The pt is now working in [place] area. 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 No actual dust. 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  July 14, 2010 2:10 PM  Pt is worse with more cough especially at night. Has to use nebulizer at night.  Worse in damp weather. No sinus drip. No real chest pain , coughing up thick brown mucus  more wheezing is noted. Notes more heartburn.  July 24, 2010 3:46 PM  Doing better. Not as much cough and is at night and is dry. Not using nebulizer now. No chest pain.  Wheezing is less. Not as much brown mucus.  December 17, 2010 11:35 AM  Noting a URI two weeks ago, now into the chest and coughing up thick brown mucus. No real chest pain. No fever. Notes more dyspnea. No edema in feet. Pt notes his cough is worse at night . Pt notes more dyspnea and notes facial pain.   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  In and out of heat.    Two weeks of cough , chest burning and dyspnea.   Notes some wheezing.   Review of Systems     Objective:   Physical Exam        Assessment & Plan:

## 2011-05-29 NOTE — Assessment & Plan Note (Signed)
Acute tracheobronchitis with flare Plan augmentin x 7days Pulse pred Start Daliresp after above completed

## 2011-07-29 LAB — GLUCOSE, CAPILLARY: Glucose-Capillary: 131 mg/dL — ABNORMAL HIGH (ref 70–99)

## 2011-09-13 ENCOUNTER — Other Ambulatory Visit: Payer: Self-pay | Admitting: Critical Care Medicine

## 2011-10-07 ENCOUNTER — Ambulatory Visit (INDEPENDENT_AMBULATORY_CARE_PROVIDER_SITE_OTHER): Payer: BC Managed Care – PPO | Admitting: Critical Care Medicine

## 2011-10-07 ENCOUNTER — Encounter: Payer: Self-pay | Admitting: Critical Care Medicine

## 2011-10-07 DIAGNOSIS — J441 Chronic obstructive pulmonary disease with (acute) exacerbation: Secondary | ICD-10-CM

## 2011-10-07 MED ORDER — OMEPRAZOLE 20 MG PO CPDR: 20.0000 mg | DELAYED_RELEASE_CAPSULE | Freq: Every day | ORAL | Status: DC

## 2011-10-07 MED ORDER — HYDROCODONE-HOMATROPINE 5-1.5 MG/5ML PO SYRP: 5.0000 mL | ORAL_SOLUTION | ORAL | Status: AC | PRN

## 2011-10-07 MED ORDER — PREDNISONE 10 MG PO TABS: ORAL_TABLET | ORAL | Status: DC

## 2011-10-07 MED ORDER — LEVOFLOXACIN 750 MG PO TABS: 750.0000 mg | ORAL_TABLET | Freq: Every day | ORAL | Status: AC

## 2011-10-07 NOTE — Patient Instructions (Signed)
Levaquin for 5 days (generic) Prednisone 10mg  Take 4 for three days 3 for three days 2 for three days 1 for three days and stop Omeprazole take one daily 1/2 hour before breakfast then eat for 30days, no refill Cough  Syrup as needed All Rx sent to pharmacy downstairs Return 2 months

## 2011-10-07 NOTE — Progress Notes (Signed)
Subjective:    Patient ID: [name], male    DOB: June 14, 1946, 65 y.o.   MRN: [id number]  HPI  64 years old male with known history of COPD and asthmatic bronchitis  March 31, 2010 --Presents for an acute office visit. Complains of prod cough with thick brown mucus over last 3-4 weeks . Worse for last week, keeping him up at night. Denies chest pain, dyspnea, orthopnea, hemoptysis, fever, n/v/d, edema, headache,recent travel or antibiotics.  Uisng nyquil wihtout relief.  May 12, 2010 9:21 AM  The pt was ok but every night will choke up and mucus at night. The mucus was white and green and now is brown. This started 3-4 days after TP saw the pt on 03/31/10. Has a cold all the time. Dyspnea is ok, just cough. Not much nasal drip. No sinus pressure or headaches. No smoking. The pt is now working in [place] area. 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 No actual dust. 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  July 14, 2010 2:10 PM  Pt is worse with more cough especially at night. Has to use nebulizer at night.  Worse in damp weather. No sinus drip. No real chest pain , coughing up thick brown mucus  more wheezing is noted. Notes more heartburn.  July 24, 2010 3:46 PM  Doing better. Not as much cough and is at night and is dry. Not using nebulizer now. No chest pain.  Wheezing is less. Not as much brown mucus.  December 17, 2010 11:35 AM  Noting a URI two weeks ago, now into the chest and coughing up thick brown mucus. No real chest pain. No fever. Notes more dyspnea. No edema in feet. Pt notes his cough is worse at night . Pt notes more dyspnea and notes facial pain.   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  In and out of heat.    Two weeks of cough , chest burning and dyspnea.   Notes some wheezing.  10/07/2011 Since last OV noting more cough for two weeks.  Cough is worse as starts to eat and worse at night. Notes more heartburn.   Throat is not  sore.  No real chest pain.  Notes some wheezes. No real edema in feet.     Review of Systems Constitutional:   No  weight loss, night sweats,  Fevers, chills, fatigue, lassitude. HEENT:   No headaches,  Difficulty swallowing,  Tooth/dental problems,  Sore throat,                No sneezing, itching, ear ache, nasal congestion, post nasal drip,   CV:  No chest pain,  Orthopnea, PND, swelling in lower extremities, anasarca, dizziness, palpitations  GI  No heartburn, indigestion, abdominal pain, nausea, vomiting, diarrhea, change in bowel habits, loss of appetite  Resp: No shortness of breath with exertion or at rest.  No excess mucus, no productive cough,  No non-productive cough,  No coughing up of blood.  No change in color of mucus.  No wheezing.  No chest wall deformity  Skin: no rash or lesions.  GU: no dysuria, change in color of urine, no urgency or frequency.  No flank pain.  MS:  No joint pain or swelling.  No decreased range of motion.  No back pain.  Psych:  No change in mood or affect. No depression or anxiety.  No memory loss.      Objective:  Physical Exam  Filed Vitals:   10/07/11 0948  BP: 140/90  Pulse: 82  Temp: 98.3 F (36.8 C)  TempSrc: Oral  Height: 5\' 7"  (1.702 m)  Weight: 234 lb 8 oz (106.369 kg)  SpO2: 96%    Gen: Pleasant, well-nourished, in no distress,  normal affect  ENT: No lesions,  mouth clear,  oropharynx clear, no postnasal drip  Neck: No JVD, no TMG, no carotid bruits  Lungs: No use of accessory muscles, no dullness to percussion, insp/exp wheeze  Cardiovascular: RRR, heart sounds normal, no murmur or gallops, no peripheral edema  Abdomen: soft and NT, no HSM,  BS normal  Musculoskeletal: No deformities, no cyanosis or clubbing  Neuro: alert, non focal  Skin: Warm, no lesions or rashes  No results found.        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 albuterol (VENTOLIN HFA) 108 (90 BASE) MCG/ACT inhaler Inhale 2 puffs into the lungs every 4 (four) hours as needed.        Marland Kitchen amLODipine (NORVASC) 10 MG tablet Take by mouth Daily.      Marland Kitchen ezetimibe-simvastatin (VYTORIN) 10-80 MG per tablet Take 1 tablet by mouth daily.       . Fluticasone-Salmeterol (ADVAIR DISKUS) 500-50 MCG/DOSE AEPB Inhale 1 puff into the lungs 2 (two) times daily.  180 each  3  . metFORMIN (GLUCOPHAGE-XR) 500 MG 24 hr tablet Take 500 mg by mouth daily with breakfast.        . montelukast (SINGULAIR) 10 MG tablet Take 1 tablet (10 mg total) by mouth daily.  90 tablet  3  . roflumilast (DALIRESP) 500 MCG TABS tablet Take 1 tablet (500 mcg total) by mouth daily.  30 tablet  6  . Tamsulosin HCl (FLOMAX) 0.4 MG CAPS Take 0.4 mg by mouth daily.        Marland Kitchen tiotropium (SPIRIVA HANDIHALER) 18 MCG inhalation capsule Place 1 capsule (18 mcg total) into inhaler and inhale daily.  90 capsule  3  . valACYclovir (VALTREX) 500 MG tablet as needed.       . valsartan-hydrochlorothiazide (DIOVAN-HCT) 320-25 MG per tablet Take 1 tablet by mouth daily.        Marland Kitchen DISCONTD: predniSONE (DELTASONE) 10 MG tablet Take 4 for three days, 3 for three days, 2 for three days, 1 for three days and stop   30 tablet  0  . HYDROcodone-homatropine (HYCODAN) 5-1.5 MG/5ML syrup Take 5 mLs by mouth every 4 (four) hours as needed for cough.  120 mL  0  . levofloxacin (LEVAQUIN) 750 MG tablet Take 1 tablet (750 mg total) by mouth daily.  5 tablet  0  . omeprazole (PRILOSEC) 20 MG capsule Take 1 capsule (20 mg total) by mouth daily.  30 capsule  0  . predniSONE (DELTASONE) 10 MG tablet Take 4 for three days 3 for three days 2 for three days 1 for three days and stop  30 tablet  0  . DISCONTD: SINGULAIR 10 MG tablet Take by  mouth Daily.

## 2011-10-08 NOTE — Assessment & Plan Note (Addendum)
Recurrent tracheobronchitis with flare with gerd flare  Plan Recycle ABX, steroids PPI x 30days Reflux diet

## 2011-11-11 ENCOUNTER — Other Ambulatory Visit: Payer: Self-pay | Admitting: Critical Care Medicine

## 2011-12-04 DIAGNOSIS — I6529 Occlusion and stenosis of unspecified carotid artery: Secondary | ICD-10-CM | POA: Insufficient documentation

## 2011-12-20 ENCOUNTER — Other Ambulatory Visit: Payer: Self-pay | Admitting: Critical Care Medicine

## 2011-12-21 NOTE — Telephone Encounter (Signed)
Per OV note with Dr. Delford Field on 10/07/11, pt was to do a PPI x 30 days.    Patient Instructions     Levaquin for 5 days (generic)  Prednisone 10mg  Take 4 for three days 3 for three days 2 for three days 1 for three days and stop  Omeprazole take one daily 1/2 hour before breakfast then eat for 30days, no refill  Cough Syrup as needed  All Rx sent to pharmacy downstairs  Return 2 months     I attempted to call pt's home # - line busy x 3.  I called # listed as cell - [email or web address] to clarify this with pt.

## 2011-12-22 ENCOUNTER — Telehealth: Payer: Self-pay | Admitting: Critical Care Medicine

## 2011-12-22 NOTE — Telephone Encounter (Signed)
Received refill request for omeprazole 20 mg from pharmacy.  However, per last OV with Dr. Delford Field on 10/07/11 pt was supposed to be on PPI x 30 days.   Patient Instructions     Levaquin for 5 days (generic)  Prednisone 10mg  Take 4 for three days 3 for three days 2 for three days 1 for three days and stop  Omeprazole take one daily 1/2 hour before breakfast then eat for 30days, no refill  Cough Syrup as needed  All Rx sent to pharmacy downstairs  Return 2 months     I spoke with pt.  Pt states he is still taking the omeprazole and does seem to be helping him.  States he does not have any reflux.  He is ok if he is not supposed to continue this medication but will continue it if needed.  Advised I will forward message to Dr. Delford Field to clarify if he should stop or continue this and will call back tomorrow.  He is ok with this.  Dr. Delford Field, pls advise.  Thanks!  Note: Pt requesting we leave detailed message if no answer.

## 2011-12-22 NOTE — Telephone Encounter (Signed)
[name] were you trying to contact patient? I don't see any msgs.

## 2011-12-23 NOTE — Telephone Encounter (Signed)
Called - left detailed message on pt's VM per his requesting stating we will send omeprazole rx to [facility] and to call back if anything further is needed.  Will send rx via rx request received in my box and sign off on this message.

## 2011-12-23 NOTE — Telephone Encounter (Signed)
Ok to refill 

## 2011-12-23 NOTE — Telephone Encounter (Signed)
Please see phone note from 12/22/11 for additional info -- rx will be sent.

## 2011-12-23 NOTE — Telephone Encounter (Signed)
RX with [facility] nurse. Will forward to [name] so she may handle. [name] called and spoke with the patient.

## 2012-02-18 ENCOUNTER — Encounter: Payer: Self-pay | Admitting: Internal Medicine

## 2012-03-06 ENCOUNTER — Other Ambulatory Visit: Payer: Self-pay | Admitting: Critical Care Medicine

## 2012-03-27 ENCOUNTER — Other Ambulatory Visit: Payer: Self-pay | Admitting: Critical Care Medicine

## 2012-03-28 ENCOUNTER — Telehealth: Payer: Self-pay | Admitting: Critical Care Medicine

## 2012-03-28 NOTE — Telephone Encounter (Signed)
Pt adds that [facility] will need [facility] as he can't use phone at work and will have to call back. Hazel Sams

## 2012-03-28 NOTE — Telephone Encounter (Signed)
Pt just called back again. Says he can see dr Delford Field this thurs in [place] after all. No msg needed. [name]

## 2012-03-28 NOTE — Telephone Encounter (Signed)
PW, is it ok to overbook your Monday 6/10 schedule to see this pt? thanks

## 2012-03-30 ENCOUNTER — Ambulatory Visit (INDEPENDENT_AMBULATORY_CARE_PROVIDER_SITE_OTHER): Payer: BC Managed Care – PPO | Admitting: Critical Care Medicine

## 2012-03-30 ENCOUNTER — Encounter: Payer: Self-pay | Admitting: Critical Care Medicine

## 2012-03-30 VITALS — BP 112/70 | HR 84 | Temp 98.1°F | Ht 66.0 in | Wt 221.0 lb

## 2012-03-30 DIAGNOSIS — J441 Chronic obstructive pulmonary disease with (acute) exacerbation: Secondary | ICD-10-CM

## 2012-03-30 DIAGNOSIS — J449 Chronic obstructive pulmonary disease, unspecified: Secondary | ICD-10-CM

## 2012-03-30 MED ORDER — ROFLUMILAST 500 MCG PO TABS: 500.0000 ug | ORAL_TABLET | Freq: Every day | ORAL | Status: DC

## 2012-03-30 MED ORDER — FLUTICASONE-SALMETEROL 500-50 MCG/DOSE IN AEPB: 1.0000 | INHALATION_SPRAY | Freq: Two times a day (BID) | RESPIRATORY_TRACT | Status: DC

## 2012-03-30 MED ORDER — ALBUTEROL SULFATE HFA 108 (90 BASE) MCG/ACT IN AERS: 2.0000 | INHALATION_SPRAY | RESPIRATORY_TRACT | Status: DC | PRN

## 2012-03-30 MED ORDER — TIOTROPIUM BROMIDE MONOHYDRATE 18 MCG IN CAPS: 18.0000 ug | ORAL_CAPSULE | Freq: Every day | RESPIRATORY_TRACT | Status: DC

## 2012-03-30 MED ORDER — MONTELUKAST SODIUM 10 MG PO TABS: 10.0000 mg | ORAL_TABLET | Freq: Every day | ORAL | Status: DC

## 2012-03-30 NOTE — Progress Notes (Signed)
Subjective:    Patient ID: [name], male    DOB: [birth year], 66 y.o.   MRN: [id number]  HPI  03/30/2012 Pt doing well for several years. No real cough spells.  Pt will cough up in early am. No real dyspnea.  No chest pain.  Pt denies any significant sore throat, nasal congestion or excess secretions, fever, chills, sweats, unintended weight loss, pleurtic or exertional chest pain, orthopnea PND, or leg swelling Pt denies any increase in rescue therapy over baseline, denies waking up needing it or having any early am or nocturnal exacerbations of coughing/wheezing/or dyspnea. Pt also denies any obvious fluctuation in symptoms with  weather or environmental change or other alleviating or aggravating factors      Review of Systems Constitutional:   No  weight loss, night sweats,  Fevers, chills, fatigue, lassitude. HEENT:   No headaches,  Difficulty swallowing,  Tooth/dental problems,  Sore throat,                No sneezing, itching, ear ache, nasal congestion, post nasal drip,   CV:  No chest pain,  Orthopnea, PND, swelling in lower extremities, anasarca, dizziness, palpitations  GI  No heartburn, indigestion, abdominal pain, nausea, vomiting, diarrhea, change in bowel habits, loss of appetite  Resp: No shortness of breath with exertion or at rest.  No excess mucus, no productive cough,  No non-productive cough,  No coughing up of blood.  No change in color of mucus.  No wheezing.  No chest wall deformity  Skin: no rash or lesions.  GU: no dysuria, change in color of urine, no urgency or frequency.  No flank pain.  MS:  No joint pain or swelling.  No decreased range of motion.  No back pain.  Psych:  No change in mood or affect. No depression or anxiety.  No memory loss.      Objective:   Physical Exam  Filed Vitals:   03/30/12 1015  BP: 112/70  Pulse: 84  Temp: 98.1 F (36.7 C)  TempSrc: Oral  Height: 5\' 6"  (1.676 m)  Weight: 221 lb (100.245 kg)  SpO2: 94%     Gen: Pleasant, well-nourished, in no distress,  normal affect  ENT: No lesions,  mouth clear,  oropharynx clear, no postnasal drip  Neck: No JVD, no TMG, no carotid bruits  Lungs: No use of accessory muscles, no dullness to percussion, distant breath sounds   Cardiovascular: RRR, heart sounds normal, no murmur or gallops, no peripheral edema  Abdomen: soft and NT, no HSM,  BS normal  Musculoskeletal: No deformities, no cyanosis or clubbing  Neuro: alert, non focal  Skin: Warm, no lesions or rashes  No results found.        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 albuterol (VENTOLIN HFA) 108 (90 BASE) MCG/ACT inhaler Inhale 2 puffs into the lungs every 4 (four) hours as needed.  1 Inhaler  11  . amLODipine (NORVASC) 10 MG tablet Take by mouth Daily.      Marland Kitchen ezetimibe-simvastatin (VYTORIN) 10-80 MG per tablet Take 1 tablet by mouth daily.       . Fluticasone-Salmeterol (ADVAIR DISKUS) 500-50 MCG/DOSE AEPB Inhale 1 puff into the lungs 2 (two) times  daily.  60 each  11  . metFORMIN (GLUCOPHAGE-XR) 500 MG 24 hr tablet Take 500 mg by mouth daily with breakfast.        . montelukast (SINGULAIR) 10 MG tablet Take 1 tablet (10 mg total) by mouth daily.  30 tablet  11  . omeprazole (PRILOSEC) 20 MG capsule TAKE 1 CAPSULE BY MOUTH DAILY.  30 capsule  0  . roflumilast (DALIRESP) 500 MCG TABS tablet Take 1 tablet (500 mcg total) by mouth daily.  30 tablet  11  . Tamsulosin HCl (FLOMAX) 0.4 MG CAPS Take 0.4 mg by mouth daily.        Marland Kitchen tiotropium (SPIRIVA HANDIHALER) 18 MCG inhalation capsule Place 1 capsule (18 mcg total) into inhaler and inhale daily.  30 capsule  11  .  TRAVATAN Z 0.004 % SOLN ophthalmic solution Place 1 drop into both eyes Daily.       . valACYclovir (VALTREX) 500 MG tablet as needed.       . valsartan-hydrochlorothiazide (DIOVAN-HCT) 320-25 MG per tablet Take 1 tablet by mouth daily.        Marland Kitchen DISCONTD: albuterol (VENTOLIN HFA) 108 (90 BASE) MCG/ACT inhaler Inhale 2 puffs into the lungs every 4 (four) hours as needed.        Marland Kitchen DISCONTD: Fluticasone-Salmeterol (ADVAIR DISKUS) 500-50 MCG/DOSE AEPB Inhale 1 puff into the lungs 2 (two) times daily.  180 each  3  . DISCONTD: montelukast (SINGULAIR) 10 MG tablet Take 1 tablet (10 mg total) by mouth daily.  90 tablet  3  . DISCONTD: roflumilast (DALIRESP) 500 MCG TABS tablet Take 1 tablet (500 mcg total) by mouth daily.  30 tablet  6  . DISCONTD: roflumilast (DALIRESP) 500 MCG TABS tablet Take 1 tablet (500 mcg total) by mouth daily.  30 tablet  11  . DISCONTD: SPIRIVA HANDIHALER 18 MCG inhalation capsule PLACE 1 CAPSULE INTO INHALER AND INHALE DAILY  30 capsule  3  . DISCONTD: predniSONE (DELTASONE) 10 MG tablet Take 4 for three days 3 for three days 2 for three days 1 for three days and stop  30 tablet  0

## 2012-03-30 NOTE — Assessment & Plan Note (Signed)
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

## 2012-03-30 NOTE — Patient Instructions (Signed)
No change in medications. Return in         6 months Refills sent downstairs

## 2012-05-25 ENCOUNTER — Encounter: Payer: Self-pay | Admitting: Adult Health

## 2012-05-25 ENCOUNTER — Ambulatory Visit (INDEPENDENT_AMBULATORY_CARE_PROVIDER_SITE_OTHER): Payer: BC Managed Care – PPO | Admitting: Adult Health

## 2012-05-25 ENCOUNTER — Encounter: Payer: Self-pay | Admitting: *Deleted

## 2012-05-25 VITALS — BP 130/66 | HR 109 | Temp 99.6°F | Ht 66.0 in | Wt 216.0 lb

## 2012-05-25 DIAGNOSIS — J449 Chronic obstructive pulmonary disease, unspecified: Secondary | ICD-10-CM

## 2012-05-25 MED ORDER — HYDROCODONE-HOMATROPINE 5-1.5 MG/5ML PO SYRP: 5.0000 mL | ORAL_SOLUTION | Freq: Four times a day (QID) | ORAL | Status: AC | PRN

## 2012-05-25 MED ORDER — PREDNISONE 10 MG PO TABS: ORAL_TABLET | ORAL | Status: DC

## 2012-05-25 MED ORDER — AMOXICILLIN-POT CLAVULANATE 875-125 MG PO TABS: 1.0000 | ORAL_TABLET | Freq: Two times a day (BID) | ORAL | Status: AC

## 2012-05-25 NOTE — Assessment & Plan Note (Signed)
Flare   Plan  Augmentin one twice daily for 7 days take w/ food  Mucinex DM Twice daily  As needed  Cough/congestion  Fluids and rest  Hydromet 1-2 tsp every 4-6 hr As needed  Cough, may make you sleepy  Prednisone taper over next week.  Please contact office for sooner follow up if symptoms do not improve or worsen or seek emergency care  follow up Dr. Delford Field  As planned and As needed

## 2012-05-25 NOTE — Progress Notes (Signed)
Subjective:    Patient ID: [name], male    DOB: 1946-02-03, 66 y.o.   MRN: [id number]  HPI 66 yo with known hx of COPD, former smoker  05/25/2012 Acute OV  Complains of prod cough with brown- to green mucus, increased SOB, wheezing, tightness in chest x5days .  otc not helping  Cough is keeping him up at night He denies any hemoptysis, chest pain, orthopnea, PND, or leg swelling       Review of Systems Constitutional:   No  weight loss, night sweats,  Fevers, chills, fatigue, lassitude. HEENT:   No headaches,  Difficulty swallowing,  Tooth/dental problems,  Sore throat,                No sneezing, itching, ear ache, + nasal congestion, post nasal drip,   CV:  No chest pain,  Orthopnea, PND, swelling in lower extremities, anasarca, dizziness, palpitations  GI  No heartburn, indigestion, abdominal pain, nausea, vomiting, diarrhea, change in bowel habits, loss of appetite  Resp:    No coughing up of blood.    No chest wall deformity  Skin: no rash or lesions.  GU: no dysuria, change in color of urine, no urgency or frequency.  No flank pain.  MS:  No joint pain or swelling.  No decreased range of motion.  No back pain.  Psych:  No change in mood or affect. No depression or anxiety.  No memory loss.      Objective:   Physical Exam  Filed Vitals:   05/25/12 1630  BP: 130/66  Pulse: 109  Temp: 99.6 F (37.6 C)  TempSrc: Oral  Height: 5\' 6"  (1.676 m)  Weight: 216 lb (97.977 kg)  SpO2: 93%    Gen: Pleasant, well-nourished, in no distress,  normal affect  ENT: No lesions,  mouth clear,  oropharynx clear, no postnasal drip  Neck: No JVD, no TMG, no carotid bruits  Lungs: coarse BS w/ exp wheezing   Cardiovascular: RRR, heart sounds normal, no murmur or gallops, no peripheral edema  Abdomen: soft and NT, no HSM,  BS normal  Musculoskeletal: No deformities, no cyanosis or clubbing  Neuro: alert, non focal  Skin: Warm, no lesions or rashes  No  results found.        Assessment & Plan:   No problem-specific assessment & plan notes found for this encounter.   Updated Medication List Outpatient Encounter Prescriptions as of 05/25/2012  Medication Sig Dispense Refill  . albuterol (VENTOLIN HFA) 108 (90 BASE) MCG/ACT inhaler Inhale 2 puffs into the lungs every 4 (four) hours as needed.  1 Inhaler  11  . amLODipine (NORVASC) 10 MG tablet Take by mouth Daily.      Marland Kitchen ezetimibe-simvastatin (VYTORIN) 10-80 MG per tablet Take 1 tablet by mouth daily.       . Fluticasone-Salmeterol (ADVAIR DISKUS) 500-50 MCG/DOSE AEPB Inhale 1 puff into the lungs 2 (two) times daily.  60 each  11  . metFORMIN (GLUCOPHAGE-XR) 500 MG 24 hr tablet Take 500 mg by mouth daily with breakfast.        . montelukast (SINGULAIR) 10 MG tablet Take 1 tablet (10 mg total) by mouth daily.  30 tablet  11  . pantoprazole (PROTONIX) 20 MG tablet Take 20 mg by mouth daily.      . roflumilast (DALIRESP) 500 MCG TABS tablet Take 1 tablet (500 mcg total) by mouth daily.  30 tablet  11  . Tamsulosin HCl (FLOMAX) 0.4 MG CAPS  Take 0.4 mg by mouth daily.        Marland Kitchen tiotropium (SPIRIVA HANDIHALER) 18 MCG inhalation capsule Place 1 capsule (18 mcg total) into inhaler and inhale daily.  30 capsule  11  . TRAVATAN Z 0.004 % SOLN ophthalmic solution Place 1 drop into both eyes Daily.       . valACYclovir (VALTREX) 500 MG tablet as needed.       . valsartan-hydrochlorothiazide (DIOVAN-HCT) 320-25 MG per tablet Take 1 tablet by mouth daily.        Marland Kitchen amoxicillin-clavulanate (AUGMENTIN) 875-125 MG per tablet Take 1 tablet by mouth 2 (two) times daily.  14 tablet  0  . HYDROcodone-homatropine (HYDROMET) 5-1.5 MG/5ML syrup Take 5 mLs by mouth every 6 (six) hours as needed for cough.  240 mL  0  . predniSONE (DELTASONE) 10 MG tablet 4 tabs for 2 days, then 3 tabs for 2 days, 2 tabs for 2 days, then 1 tab for 2 days, then stop  20 tablet  0  . DISCONTD: omeprazole (PRILOSEC) 20 MG capsule TAKE 1  CAPSULE BY MOUTH DAILY.  30 capsule  0

## 2012-05-25 NOTE — Patient Instructions (Addendum)
Augmentin one twice daily for 7 days take w/ food  Mucinex DM Twice daily  As needed  Cough/congestion  Fluids and rest  Hydromet 1-2 tsp every 4-6 hr As needed  Cough, may make you sleepy  Prednisone taper over next week.  Please contact office for sooner follow up if symptoms do not improve or worsen or seek emergency care  follow up Dr. Delford Field  As planned and As needed

## 2012-07-31 ENCOUNTER — Other Ambulatory Visit: Payer: Self-pay | Admitting: Critical Care Medicine

## 2012-08-01 NOTE — Telephone Encounter (Signed)
Spiriva rx was last sent to [facility] on 03/30/12 #30 x 11.  Kiana at [facility] verified this rx was received.  She will get rx ready for pt.  Nothing further needed.

## 2012-08-14 ENCOUNTER — Telehealth: Payer: Self-pay | Admitting: Critical Care Medicine

## 2012-08-14 NOTE — Telephone Encounter (Signed)
Pt aware and needed nothing further 

## 2012-08-14 NOTE — Telephone Encounter (Signed)
Yes of course

## 2012-08-14 NOTE — Telephone Encounter (Signed)
LMTCB

## 2012-08-14 NOTE — Telephone Encounter (Signed)
Spoke with pt. He states that he is now working 3rd shift, but still takes his meds in the am- spiriva and pred. This is making it hard for him to sleep and so wants to know if he can take them at night before going to work. I advised this should be fine, but he still wants to make sure okay with PW. Please advise thanks

## 2012-08-14 NOTE — Telephone Encounter (Signed)
Returning call.

## 2012-12-01 ENCOUNTER — Telehealth: Payer: Self-pay | Admitting: Critical Care Medicine

## 2012-12-01 DIAGNOSIS — J441 Chronic obstructive pulmonary disease with (acute) exacerbation: Secondary | ICD-10-CM

## 2012-12-01 MED ORDER — ROFLUMILAST 500 MCG PO TABS: 500.0000 ug | ORAL_TABLET | Freq: Every day | ORAL | Status: DC

## 2012-12-01 MED ORDER — MONTELUKAST SODIUM 10 MG PO TABS: 10.0000 mg | ORAL_TABLET | Freq: Every day | ORAL | Status: DC

## 2012-12-01 MED ORDER — TIOTROPIUM BROMIDE MONOHYDRATE 18 MCG IN CAPS: 18.0000 ug | ORAL_CAPSULE | Freq: Every day | RESPIRATORY_TRACT | Status: DC

## 2012-12-01 MED ORDER — ALBUTEROL SULFATE HFA 108 (90 BASE) MCG/ACT IN AERS: 2.0000 | INHALATION_SPRAY | RESPIRATORY_TRACT | Status: DC | PRN

## 2012-12-01 MED ORDER — FLUTICASONE-SALMETEROL 500-50 MCG/DOSE IN AEPB: 1.0000 | INHALATION_SPRAY | Freq: Two times a day (BID) | RESPIRATORY_TRACT | Status: DC

## 2012-12-01 NOTE — Telephone Encounter (Signed)
Called and spoke with pt and he is aware that rx have been printed out and we will mail them to his home once these rx are signed by PW.  Nothing further is needed.

## 2012-12-04 ENCOUNTER — Telehealth: Payer: Self-pay | Admitting: Critical Care Medicine

## 2012-12-04 NOTE — Telephone Encounter (Signed)
rxs signed by Dr. Delford Field and  placed in mail -- lmomtcb to inform pt.

## 2012-12-04 NOTE — Telephone Encounter (Signed)
Was calling to inform pt:  Raford Pitcher, RN at 12/04/2012 2:28 PM   Status: Signed            rxs signed by Dr. Delford Field and placed in mail -- lmomtcb to inform pt.   --------  Called, spoke with pt.  Informed him rxs have been signed by PW and placed in mail.  He verbalized understanding of this and voiced no further questions or concerns at this time.

## 2012-12-04 NOTE — Telephone Encounter (Signed)
Pt states he is returning the call of PW's nurse.  Antionette Fairy

## 2012-12-11 ENCOUNTER — Other Ambulatory Visit: Payer: Self-pay | Admitting: Critical Care Medicine

## 2012-12-13 ENCOUNTER — Other Ambulatory Visit: Payer: Self-pay | Admitting: Critical Care Medicine

## 2013-01-31 ENCOUNTER — Encounter: Payer: Self-pay | Admitting: Internal Medicine

## 2013-02-07 ENCOUNTER — Encounter: Payer: Self-pay | Admitting: Internal Medicine

## 2013-02-13 ENCOUNTER — Encounter: Payer: Self-pay | Admitting: *Deleted

## 2013-02-13 ENCOUNTER — Ambulatory Visit (INDEPENDENT_AMBULATORY_CARE_PROVIDER_SITE_OTHER): Payer: BC Managed Care – PPO | Admitting: Pulmonary Disease

## 2013-02-13 ENCOUNTER — Encounter: Payer: Self-pay | Admitting: Pulmonary Disease

## 2013-02-13 ENCOUNTER — Telehealth: Payer: Self-pay | Admitting: Critical Care Medicine

## 2013-02-13 VITALS — BP 126/86 | HR 89 | Temp 98.8°F | Ht 66.0 in | Wt 207.0 lb

## 2013-02-13 DIAGNOSIS — J449 Chronic obstructive pulmonary disease, unspecified: Secondary | ICD-10-CM

## 2013-02-13 MED ORDER — AZITHROMYCIN 250 MG PO TABS: ORAL_TABLET | ORAL | Status: DC

## 2013-02-13 MED ORDER — PREDNISONE 10 MG PO TABS: ORAL_TABLET | ORAL | Status: DC

## 2013-02-13 NOTE — Telephone Encounter (Signed)
Called # listed above and it states its no longer in service.  Called mobile # [email or web address] [phone]

## 2013-02-13 NOTE — Telephone Encounter (Signed)
I spoke with the pt and he was asking if there are any generics for his inhalers. I advised there are not any generics at this time. He states understanding. [name], CMA

## 2013-02-13 NOTE — Telephone Encounter (Signed)
Pt returned triage's call & asked to be reached on [phone].  Antionette Fairy

## 2013-02-13 NOTE — Assessment & Plan Note (Signed)
Will treat as COPD flare but he likely has chronic asthma since quit smoking in 70's Prednisone 10 mg tabs -Take 4 tabs  daily with food x 4 days, then 3 tabs daily x 4 days, then 2 tabs daily x 4 days, then 1 tab daily x4 days then stop. #40 Z-pak, since he has green sputum OK to take 2 days off work - letter

## 2013-02-13 NOTE — Patient Instructions (Addendum)
Prednisone 10 mg tabs -Take 4 tabs  daily with food x 4 days, then 3 tabs daily x 4 days, then 2 tabs daily x 4 days, then 1 tab daily x4 days then stop. #40 Z-pak OK to take 2 days off work - letter Keep appt with Dr Delford Field

## 2013-02-13 NOTE — Progress Notes (Signed)
  Subjective:    Patient ID: [name], male    DOB: [birth year], 67 y.o.   MRN: [id number]  HPI 67 yo with known hx of COPD/ asthma, former smoker -quit 70's  05/25/2012 Acute OV  >> augmentin, prednisone  02/13/2013  PW pt.  Reports increased DOE, wheezing qhs mostly, and prod cough with green x 3 days.  No chest tightness, chest pain, or f/c/s. Feels may be related to pollen, no preceding URi symptoms  Past Medical History  Diagnosis Date  . Thigh pain   . Anemia, unspecified   . Bronchitis, chronic obstructive, with exacerbation   . Hypertension   . Hyperlipidemia   . GERD (gastroesophageal reflux disease)   . Diabetes mellitus type II   . COPD (chronic obstructive pulmonary disease)   . History of colonic polyps   . Diverticulosis   . Hemorrhoids       Review of Systems neg for any significant sore throat, dysphagia, itching, sneezing, nasal congestion or excess/ purulent secretions, fever, chills, sweats, unintended wt loss, pleuritic or exertional cp, hempoptysis, orthopnea pnd or change in chronic leg swelling. Also denies presyncope, palpitations, heartburn, abdominal pain, nausea, vomiting, diarrhea or change in bowel or urinary habits, dysuria,hematuria, rash, arthralgias, visual complaints, headache, numbness weakness or ataxia.     Objective:   Physical Exam  Gen. Pleasant, well-nourished, in no distress, normal affect ENT - no lesions, no post nasal drip Neck: No JVD, no thyromegaly, no carotid bruits Lungs: no use of accessory muscles, no dullness to percussion, no rales, faint scattered rhonchi  Cardiovascular: Rhythm regular, heart sounds  normal, no murmurs or gallops, no peripheral edema Abdomen: soft and non-tender, no hepatosplenomegaly, BS normal. Musculoskeletal: No deformities, no cyanosis or clubbing Neuro:  alert, non focal        Assessment & Plan:

## 2013-03-01 ENCOUNTER — Ambulatory Visit: Payer: BC Managed Care – PPO | Admitting: Critical Care Medicine

## 2013-04-08 ENCOUNTER — Other Ambulatory Visit: Payer: Self-pay | Admitting: Critical Care Medicine

## 2013-04-11 ENCOUNTER — Telehealth: Payer: Self-pay | Admitting: Critical Care Medicine

## 2013-04-11 NOTE — Telephone Encounter (Signed)
Pt was last seen by PW on 03/30/12.  He was asked to f/u in 6 months. He was seen by RA on 02/13/13 as an acute and was asked to keep OV with PW. Pt has no OV currently scheduled. Rxs sent as pt was has been seen by RA.   lmomtcb to schedule yearly OV with PW for additional rxs.

## 2013-04-11 NOTE — Telephone Encounter (Signed)
Pt scheduled yearly appt. Nothing further was needed

## 2013-05-03 ENCOUNTER — Ambulatory Visit (INDEPENDENT_AMBULATORY_CARE_PROVIDER_SITE_OTHER): Payer: BC Managed Care – PPO | Admitting: Critical Care Medicine

## 2013-05-03 ENCOUNTER — Encounter: Payer: Self-pay | Admitting: Critical Care Medicine

## 2013-05-03 VITALS — BP 132/74 | HR 77 | Temp 98.9°F | Ht 66.0 in | Wt 201.0 lb

## 2013-05-03 DIAGNOSIS — J449 Chronic obstructive pulmonary disease, unspecified: Secondary | ICD-10-CM

## 2013-05-03 MED ORDER — CEFUROXIME AXETIL 500 MG PO TABS: 500.0000 mg | ORAL_TABLET | Freq: Two times a day (BID) | ORAL | Status: DC

## 2013-05-03 MED ORDER — PREDNISONE 10 MG PO TABS: ORAL_TABLET | ORAL | Status: DC

## 2013-05-03 MED ORDER — HYDROCODONE-HOMATROPINE 5-1.5 MG/5ML PO SYRP: 5.0000 mL | ORAL_SOLUTION | Freq: Four times a day (QID) | ORAL | Status: DC | PRN

## 2013-05-03 NOTE — Progress Notes (Signed)
Subjective:    Patient ID: [name], male    DOB: 1946/08/02, 67 y.o.   MRN: [id number]  HPI  67 y.o.  with known hx of COPD/ asthma, former smoker -quit 70's   05/03/2013 Chief Complaint  Patient presents with  . Follow-up    c/o cough with green mucus, worse qhs.  No chest tightness, chest pain, wheezing, or f/c/s.  Now coughing up thick green mucus, worse at night.  No real wheeze.  Pt worse daytime.  Notes more dyspnea  Notes some qhs dyspnea.    Past Medical History  Diagnosis Date  . Thigh pain   . Anemia, unspecified   . Bronchitis, chronic obstructive, with exacerbation   . Hypertension   . Hyperlipidemia   . GERD (gastroesophageal reflux disease)   . Diabetes mellitus type II   . COPD (chronic obstructive pulmonary disease)   . History of colonic polyps   . Diverticulosis   . Hemorrhoids       Review of Systems  Constitutional:   No  weight loss, night sweats,  Fevers, chills, fatigue, lassitude. HEENT:   No headaches,  Difficulty swallowing,  Tooth/dental problems,  Sore throat,                No sneezing, itching, ear ache, nasal congestion, post nasal drip,   CV:  No chest pain,  Orthopnea, PND, swelling in lower extremities, anasarca, dizziness, palpitations  GI  No heartburn, indigestion, abdominal pain, nausea, vomiting, diarrhea, change in bowel habits, loss of appetite  Resp:++ shortness of breath with exertion not  at rest.  ++excess mucus, no productive cough,  No non-productive cough,  No coughing up of blood.  No change in color of mucus.  No wheezing.  No chest wall deformity  Skin: no rash or lesions.  GU: no dysuria, change in color of urine, no urgency or frequency.  No flank pain.  MS:  No joint pain or swelling.  No decreased range of motion.  No back pain.  Psych:  No change in mood or affect. No depression or anxiety.  No memory loss.      Objective:   Physical Exam  BP 132/74  Pulse 77  Temp(Src) 98.9 F (37.2 C)  (Oral)  Ht 5\' 6"  (1.676 m)  Wt 201 lb (91.173 kg)  BMI 32.46 kg/m2  SpO2 96%  Gen. Pleasant, well-nourished, in no distress, normal affect ENT - no lesions, no post nasal drip Neck: No JVD, no thyromegaly, no carotid bruits Lungs: no use of accessory muscles, no dullness to percussion, no rales, faint scattered rhonchi  expired wheezes Cardiovascular: Rhythm regular, heart sounds  normal, no murmurs or gallops, no peripheral edema Abdomen: soft and non-tender, no hepatosplenomegaly, BS normal. Musculoskeletal: No deformities, no cyanosis or clubbing Neuro:  alert, non focal        Assessment & Plan:   COPD gold C. with recurrent exacerbations COPD with asthmatic bronchitic component and acute exacerbation Plan Prednisone 10mg  Take 4 tablets daily for 5 days then stop Ceftin 500mg  twice daily for 5 days Cough syrup refilled No change in inhalers Return 3 months    Updated Medication List Outpatient Encounter Prescriptions as of 05/03/2013  Medication Sig Dispense Refill  . ADVAIR DISKUS 500-50 MCG/DOSE AEPB INHALE 1 PUFF INTO LUNGS TWO TIMES A DAY  3 each  0  . amLODipine (NORVASC) 10 MG tablet Take by mouth Daily.      Marland Kitchen DALIRESP 500 MCG TABS tablet TAKE  1 TABLET DAILY  90 tablet  0  . escitalopram (LEXAPRO) 10 MG tablet Take 1 tablet by mouth daily.      . montelukast (SINGULAIR) 10 MG tablet TAKE 1 TABLET DAILY  90 tablet  0  . pantoprazole (PROTONIX) 20 MG tablet Take 20 mg by mouth daily.      . simvastatin (ZOCOR) 80 MG tablet Take 1 tablet by mouth daily.      Marland Kitchen SPIRIVA HANDIHALER 18 MCG inhalation capsule INHALE THE CONTENTS OF 1 CAPSULE DAILY  1 capsule  0  . Tamsulosin HCl (FLOMAX) 0.4 MG CAPS Take 0.4 mg by mouth daily.        . TRAVATAN Z 0.004 % SOLN ophthalmic solution Place 1 drop into both eyes Daily.       . valACYclovir (VALTREX) 500 MG tablet as needed.       . valsartan-hydrochlorothiazide (DIOVAN-HCT) 320-25 MG per tablet Take 1 tablet by mouth daily.         . VENTOLIN HFA 108 (90 BASE) MCG/ACT inhaler INHALE 2 PUFFS INTO THE LUNGS EVERY 4 HOURS AS NEEDED  3 each  0  . ZETIA 10 MG tablet Take 1 tablet by mouth daily.      . cefUROXime (CEFTIN) 500 MG tablet Take 1 tablet (500 mg total) by mouth 2 (two) times daily.  10 tablet  0  . HYDROcodone-homatropine (HYCODAN) 5-1.5 MG/5ML syrup Take 5 mLs by mouth every 6 (six) hours as needed for cough.  240 mL  0  . predniSONE (DELTASONE) 10 MG tablet Take 4 tablets daily for 5 days then stop  20 tablet  0  . [DISCONTINUED] azithromycin (ZITHROMAX) 250 MG tablet Take as directed  6 each  0  . [DISCONTINUED] ezetimibe-simvastatin (VYTORIN) 10-80 MG per tablet Take 1 tablet by mouth daily.       . [DISCONTINUED] metFORMIN (GLUCOPHAGE-XR) 500 MG 24 hr tablet Take 500 mg by mouth daily with breakfast.        . [DISCONTINUED] predniSONE (DELTASONE) 10 MG tablet Take 4 tabs  daily with food x 4 days, then 3 tabs daily x 4 days, then 2 tabs daily x 4 days, then 1 tab daily x4 days then stop. #40  40 tablet  0   No facility-administered encounter medications on file as of 05/03/2013.

## 2013-05-03 NOTE — Assessment & Plan Note (Signed)
COPD with asthmatic bronchitic component and acute exacerbation Plan Prednisone 10mg  Take 4 tablets daily for 5 days then stop Ceftin 500mg  twice daily for 5 days Cough syrup refilled No change in inhalers Return 3 months

## 2013-05-03 NOTE — Patient Instructions (Addendum)
Prednisone 10mg  Take 4 tablets daily for 5 days then stop Ceftin 500mg  twice daily for 5 days Cough syrup refilled No change in inhalers Return 3 months

## 2013-06-10 ENCOUNTER — Other Ambulatory Visit: Payer: Self-pay | Admitting: Critical Care Medicine

## 2013-07-23 ENCOUNTER — Telehealth: Payer: Self-pay | Admitting: *Deleted

## 2013-07-23 NOTE — Telephone Encounter (Signed)
Pt walked in to [facility] today.  Pt states he has now switched insurance co to Medicare.  Advair and Spiriva are now both Tier 3 and Daliresp is Tier 4.  Pt is requesting these be switched to something comparable that will be a Tier 1 or 2.  Pt states insurance co did not advise him of meds that are in these Tiers and told him the office would know and this would be at the discreation of the dr.  If none or only some of these meds can be changed, pt has left a form from [employer] to be filled out for the meds that cannot be changed.  Dr. Delford Field, pls advise.  Thank you.  ** Note:  Pt states he has plenty of meds right now.  He isn't in need of any samples at this time.

## 2013-07-23 NOTE — Telephone Encounter (Signed)
Called, spoke with pt.  Informed him of below. Pt doesn't know the # to insurance co to call so we can get this information. Pt states he will call "the lady" tomorrow to see if she can tell him what meds are Tier 1 and 2 or if she knows a # we can call with this information. He is to call back with an update on this.

## 2013-07-23 NOTE — Telephone Encounter (Signed)
We need to find this infor out from his insurance co as to what is tier 1 and 2  There are no replacements for daliresp

## 2013-07-25 NOTE — Telephone Encounter (Signed)
Called, spoke with pt to follow up on this.  Pt states he spoke with his Medicare rep, Dalphine Handing, who did not know which meds are in Tier 1 and 2. He has given me her home # [phone]) and work # [phone]). Advised I would call her to discuss this further.   Called, spoke with Clelia Croft.  Per Clelia Croft, pt doesn't currently have Prescription Coverage through Medicare yet.  Clelia Croft states, because of this, pt doesn't have a # we can call to find this information out through Medicare.  Per Clelia Croft, we will have to wait until pt receives his Prescription [employer] Card with this # on it before we can proceed further with figuring out which meds are under Tier 1 and 2.  Clelia Croft also states that we should go ahead and fill out the [employer] [facility] form for pt as this isn't related to his Medicare Coverage.    lmomtcb for pt to inform him of above and ask he call us back once he receives the [employer]cription [employer] Card to give [place] the phone # to call to find out this information.  Also, I will complete the [employer] [facility] from, will have PW sign it, and call pt back once this has been completed.

## 2013-07-27 NOTE — Telephone Encounter (Signed)
[phone] Patient calling to speak to [name].

## 2013-07-27 NOTE — Telephone Encounter (Signed)
Pt is aware of the information below.  Will route this message back to [name] so that she can document once PW signs the form.

## 2013-07-30 NOTE — Telephone Encounter (Signed)
lmomtcb to inform pt that I have completed the [facility] Application for the Advair, Spiriva, and Daliresp as he requested.  I have placed this at the front desk at the [place] office.  Pt will need to pick this up, complete his part, and submit application to [facility] with any required documents if applicatable to him.    * Note:  I have made copy of this and placed in scan folder.

## 2013-07-31 NOTE — Telephone Encounter (Signed)
Pt returned call

## 2013-07-31 NOTE — Telephone Encounter (Signed)
lmomtcb for pt 

## 2013-07-31 NOTE — Telephone Encounter (Signed)
Called, spoke with pt.  Advised [facility] application has been filled out on our part and is at front desk at [facility].  He verbalized understanding and will complete his part and turn into [facility].  Pt is also to call us back once he receives the new prescription card so we have a # to call to figure out which meds are Tier 1 and 2.  He verbalized understanding and voiced no further questions or concerns at this time.

## 2013-08-15 ENCOUNTER — Telehealth: Payer: Self-pay | Admitting: Critical Care Medicine

## 2013-08-15 NOTE — Telephone Encounter (Signed)
I will forward this message to [name] per the pt's request.

## 2013-08-16 NOTE — Telephone Encounter (Signed)
Per previous phone msg, advair and spiriva will be a tier 3 under pt's new insurance.  He is requesting these meds be changed to something comparable in a tier 1 or 2 but had not yet received his new insurance card when he called.  He was to call back with the insurance # and will need his id # so we can call to see which meds are covered.  Called, spoke with pt.  Pt states the back of his insurance card has 2 #s:  [phone] and [phone].  His Medicare ID # is [id number]  Advised pt would call to see alternatives for spiriva and advair and would call him back.

## 2013-08-16 NOTE — Telephone Encounter (Signed)
Called the first # provided by pt.  Spoke with [employer].  Was advised I would need to call [phone] for this information.

## 2013-08-16 NOTE — Telephone Encounter (Signed)
Called # provided below.  Spoke with [place].  Was advised that this # cannot help with this.  We would need to get the # off of the pt's medicare part d drug card.  Called, spoke with pt.  Pt states he has not yet enrolled in Medicare Part D Prescription Coverage.  He is scheduled to meet with the lady to discuss this.  Pt states his current insurance is still paying for the inhalers.  He is to call back if he needs samples and is to call back once he receives the Medicare Part D Prescription Card so we can call to see which meds are in Tier 1 and 2 to replace Spiriva and Advair.

## 2013-08-27 ENCOUNTER — Telehealth: Payer: Self-pay | Admitting: Critical Care Medicine

## 2013-08-27 NOTE — Telephone Encounter (Signed)
I spoke with pt. He reports the # on back of his medicare part D card was for customer service [phone] and provider # [phone] Member # [phone] I called and spoke with medicare rep. She was very unhelpful and did not know anything. So I asked to speak with the supervisor. I spoke with Benard Rink. She reports she looked at pt formulary and he has not covered inhalers in tier 1 or 2. All inhalers on his plan are in tier 3 and 4. We can look into a tier exception. The advair was not able for tier exception and nothing for spiriva either. The only thing that could be done for tier exception would be albuterol HFA.    I called and made pt aware. I am going to send PA forms to [facility] tomorrow for pt to pick up. He will fill his part out and attack the forms needed and return back to [place]. Nothing further needed

## 2013-09-13 ENCOUNTER — Telehealth: Payer: Self-pay | Admitting: Critical Care Medicine

## 2013-09-13 ENCOUNTER — Telehealth: Payer: Self-pay

## 2013-09-13 MED ORDER — FLUTICASONE-SALMETEROL 500-50 MCG/DOSE IN AEPB: 1.0000 | INHALATION_SPRAY | Freq: Once | RESPIRATORY_TRACT | Status: DC

## 2013-09-13 NOTE — Telephone Encounter (Signed)
[employer] needs original RX with MD's signature mailed to them with all the paperwork

## 2013-09-13 NOTE — Telephone Encounter (Signed)
I have not seen this come through.   Called, spoke with pt.  Reports [facility] advised him they mail [place] forms to sign for rxs to start receiving meds.  Pt states they reported it was mailed to our office the same time they mailed the forms to him.  Pt received his forms on Tuesday of last week. Advised I would ask PW tomorrow if he has seen this come through given it was mailed.  If not, pt states I can call [phone] to see if we can get this faxed.

## 2013-09-13 NOTE — Telephone Encounter (Signed)
Called  And spoke with pt and he stated that he has filled out the forms for both companies and would like to go with the hope prescription since he will be able to get these medications for free.  He stated that they called him to let him know that the papers had been sent in for PW to sign and we will need to fax these back. Pt just wanted to make sure that PW did receive these papers. I will send this message to crystal to follow up on.  thanks

## 2013-09-14 NOTE — Telephone Encounter (Signed)
I spoke with PW.  He has not seen this either. I called [facility] at the # provided below. Received msg that the staff is in training today and to please call back on the next business day.   WCB on Monday.

## 2013-09-17 NOTE — Telephone Encounter (Signed)
Called, spoke with pt. He is calling to check on the status of this. Advised I call Friday, but [facility] was closed for incoming call.  Advised pt I would call back today.    I called [facility], spoke with Adelina Mings.   Was advised 3 different applications will be sent for completion.  Applications will be for Lyndal Rainbow, and Spiriva These will be faxed over by end of business day tomorrow.  I have provided triage fax # for this. Once completed, applications will need to be mail back to [facility]: [address]  [place], [place] [address]  Will await fax.  I have also lmomtcb for pt to update him on above.

## 2013-09-17 NOTE — Telephone Encounter (Signed)
Pt is calling back to speak w/ [name].  Pt can be reached at [phone].  Antionette Fairy

## 2013-09-17 NOTE — Telephone Encounter (Signed)
Pt aware of the process. [name], CMA

## 2013-09-19 MED ORDER — ROFLUMILAST 500 MCG PO TABS: ORAL_TABLET | ORAL | Status: DC

## 2013-09-19 MED ORDER — TIOTROPIUM BROMIDE MONOHYDRATE 18 MCG IN CAPS: ORAL_CAPSULE | RESPIRATORY_TRACT | Status: DC

## 2013-09-19 NOTE — Telephone Encounter (Signed)
I received the applications from [facility].   The applications received are for Dailresp, Spiriva, and Dulera. However, pt's med list does not have Dulera on it.  Dulera wasn't on pt's med list during his last OV with PW either.  Pt is on Advair.  Called, spoke with pt.  Pt states he is NOT on Dulera.  Reports he believes [facility] sent [facility] request bc he doesn't think they offer advair.  Per previous phone msg on 09/13/13, Advair rx was sent to [employer] for pt assistance by PCP office.  Pt states he hasn't heard anything from [employer] regarding Advair pt assistance yet.  Pt states he sent this into [employer] last week and will call them on Monday to check on the status.   Advised I would call [facility] to clarify reason for [facility] request.  He verbalized understanding of this.  Called [facility].  They are closed for the Holiday.  WCB.  Note:  I have completed the [facility] [facility] applications for Daliresp and Spiriva along with rxs.  PW has signed both applications and rxs.

## 2013-09-24 NOTE — Telephone Encounter (Signed)
Called, spoke with pt to f/u with him regarding Advair pt assistance with [employer]. Per pt, he is currently on hold with this company. He will call me back with an update.

## 2013-09-24 NOTE — Telephone Encounter (Signed)
Called [facility], spoke with Tammy. Was advised they no longer offer the adviar with their program.  They can substitute dulera for the advair if this is ok. Advised pt is to call today to check on the status of advair application with [employer] pt assistance. Would proceed further with this once he has confirmed the status of this. Tammy also reports we can fax the daliresp application to [phone] to have the process proceed a little faster for pt.  However, we will still need to mail the original along with the original of Spiriva to the address below.  The Daliresp application has been faxed.  Will await to speaking with pt today to mail the Daliresp and Spiriva applications.

## 2013-09-24 NOTE — Telephone Encounter (Signed)
Pt returned call to [employer] with patient who reported that he does have an update regarding the below message >> his enrollment form has not been received as of yet and was told that for some reason, mail from [place] and the [place] states take longer to arrive.  Pt stated that he was told that it should arrive by this coming Wednesday, and if not he may need to send it in again.  Pt stated that he will call at that time and keep up updated.  Will forward back to [name] so she is aware.

## 2013-09-24 NOTE — Telephone Encounter (Signed)
I have placed the original copy of the daliresp and spiriva applications in the mail along with the rxs.   I have made a copy of both of these and placed in scan folder as well. I spoke with pt.  Advised of above.  He will call back on Wednesday once he calls [employer] back regarding the advair.  I have the [facility] Application for [facility] in my folder in case needed.   Pt aware and is in agreement with this plan.

## 2013-09-26 NOTE — Telephone Encounter (Signed)
Spoke with pt.  Pt states he spoke with [employer] who advised they have not yet received the Advair application.  Pt states he was advised it may take a little longer given the Holiday last week. He will call back on Friday.  Pt was advised to resend the [employer] application if they have not received it by Friday.  Pt will call me back with an update on Friday whether he would like to resend the Advair application to [employer] if not received or if he would like to proceed with changing advair to dulera through [facility] (this is ok per PW to change advair to dulera 200 2 puffs bid if needed).    Note:  Pt states he has plenty of advair at this time.

## 2013-09-28 NOTE — Telephone Encounter (Signed)
Pt is asking to speak w/ [name] regarding Advair & can be reached at [phone].  [name]

## 2013-10-01 MED ORDER — MOMETASONE FURO-FORMOTEROL FUM 200-5 MCG/ACT IN AERO: 2.0000 | INHALATION_SPRAY | Freq: Two times a day (BID) | RESPIRATORY_TRACT | Status: DC

## 2013-10-01 NOTE — Telephone Encounter (Signed)
I printed the Dulera 200 rx.  It was signed by PW.  I have updated pt's med list to reflect this change of advair to dulera. I completed the [facility] Pt assistance application.  It was signed by PW.  I have placed a copy of this in the scan folder and the originals in the mail to [facility]. I called [facility] to update them on status - was on hold > 5 minutes.  WCB.

## 2013-10-01 NOTE — Telephone Encounter (Signed)
Called spoke with patient who reported that he did call [employer] on 12.5.14 and was informed that they do have the forms but that he was missing one part -- pt had asked them if he could asnwer/authorize over the phone but was advised that he cannot.  Pt forms are to be sent back to him and should arrive tomorrow.  Asked patient if he would like to switch to the [facility] as previously discussed or if he would like to continue trying to get his Advair.  Pt stated that if PW feels the [facility] would "work good" for him, then he is willing to go ahead and change to this medication.  Pt is aware PW is night float this week and stated he will contact his pharmacy about moving forward with the [facility].  Discussed the above with [name] and will forward message to her per her request.

## 2013-10-01 NOTE — Telephone Encounter (Signed)
Patient returning call.

## 2013-10-01 NOTE — Telephone Encounter (Signed)
Called [facility], spoke with [place].  Informed her ok to change pt from Advair to [facility] and pt would also like to proceed with this.  Advised I have placed dulera patient assistance application in mail to them.  [name] verbalized understanding and states nothing further is needed from our office at this time.  Called, spoke with pt.  Advised this has been done.  He verbalized understanding and voiced no further questions or concerns at this time.

## 2013-10-01 NOTE — Telephone Encounter (Signed)
lmomtcb for pt 

## 2013-11-15 ENCOUNTER — Telehealth: Payer: Self-pay | Admitting: Critical Care Medicine

## 2013-11-15 MED ORDER — TIOTROPIUM BROMIDE MONOHYDRATE 18 MCG IN CAPS: ORAL_CAPSULE | RESPIRATORY_TRACT | Status: DC

## 2013-11-15 NOTE — Telephone Encounter (Signed)
Spoke with pt. He reports he will be receiving PA for his spiriva. He will get this in about 1-2 weeks. He is out now and needs samples. I advised pt will leave samples to pick up in the [place] office since we are not there until next week. [facility]. Nothing further needed

## 2013-12-03 ENCOUNTER — Telehealth: Payer: Self-pay | Admitting: Critical Care Medicine

## 2013-12-03 NOTE — Telephone Encounter (Signed)
Patient assitance medication has still not been received. Pt requests samples of Spiriva. These will be left a the front desk at the [place] office.

## 2013-12-27 ENCOUNTER — Ambulatory Visit (INDEPENDENT_AMBULATORY_CARE_PROVIDER_SITE_OTHER): Payer: BC Managed Care – HMO | Admitting: Adult Health

## 2013-12-27 ENCOUNTER — Encounter: Payer: Self-pay | Admitting: Adult Health

## 2013-12-27 ENCOUNTER — Ambulatory Visit (HOSPITAL_BASED_OUTPATIENT_CLINIC_OR_DEPARTMENT_OTHER)
Admission: RE | Admit: 2013-12-27 | Discharge: 2013-12-27 | Disposition: A | Discharge status: Home / Self Care | Payer: BC Managed Care – HMO | Source: Ambulatory Visit | Attending: Adult Health | Admitting: Adult Health

## 2013-12-27 VITALS — BP 140/82 | HR 71 | Temp 98.0°F | Wt 222.0 lb

## 2013-12-27 DIAGNOSIS — R05 Cough: Secondary | ICD-10-CM

## 2013-12-27 DIAGNOSIS — R0989 Other specified symptoms and signs involving the circulatory and respiratory systems: Secondary | ICD-10-CM | POA: Insufficient documentation

## 2013-12-27 DIAGNOSIS — R059 Cough, unspecified: Secondary | ICD-10-CM | POA: Insufficient documentation

## 2013-12-27 DIAGNOSIS — J449 Chronic obstructive pulmonary disease, unspecified: Secondary | ICD-10-CM

## 2013-12-27 MED ORDER — HYDROCODONE-HOMATROPINE 5-1.5 MG/5ML PO SYRP
5.0000 mL | ORAL_SOLUTION | Freq: Four times a day (QID) | ORAL | Status: DC | PRN
Start: 2013-12-27 — End: 2014-02-13

## 2013-12-27 MED ORDER — PREDNISONE 10 MG PO TABS: ORAL_TABLET | ORAL | Status: DC

## 2013-12-27 MED ORDER — AMOXICILLIN-POT CLAVULANATE 875-125 MG PO TABS: 1.0000 | ORAL_TABLET | Freq: Two times a day (BID) | ORAL | Status: AC

## 2013-12-27 NOTE — Patient Instructions (Signed)
Augmentin one twice daily for 7 days take w/ food  Mucinex DM Twice daily  As needed  Cough/congestion  Fluids and rest  Hydromet 1-2 tsp every 4-6 hr As needed  Cough, may make you sleepy  Prednisone taper over next week.  Please contact office for sooner follow up if symptoms do not improve or worsen or seek emergency care  follow up Dr. Delford FieldWright  [facility] [place] [facility] [place] and As needed

## 2013-12-31 ENCOUNTER — Telehealth: Payer: Self-pay | Admitting: Critical Care Medicine

## 2013-12-31 NOTE — Telephone Encounter (Signed)
See cxr

## 2013-12-31 NOTE — Assessment & Plan Note (Signed)
Exacerbation  CXR today   Plan  Augmentin one twice daily for 7 days take w/ food  Mucinex DM Twice daily  As needed  Cough/congestion  Fluids and rest  Hydromet 1-2 tsp every 4-6 hr As needed  Cough, may make you sleepy  Prednisone taper over next week.  Please contact office for sooner follow up if symptoms do not improve or worsen or seek emergency care  follow up Dr. Delford FieldWright  [facility] and As needed

## 2013-12-31 NOTE — Telephone Encounter (Signed)
Spoke with pt and advised that TP is out of office today but will address cxr results first thing tomorrow morning and we will call pt.  Pt verbalized understanding.

## 2013-12-31 NOTE — Progress Notes (Signed)
   Subjective:    Patient ID: [name], male    DOB: 11/01/1945, 68 y.o.   MRN: [id number]  HPI 68 yo male with known hx of COPD   12/27/13 Acute OV  Pt presents for an acute ov; He complains of  Complains of prod cough brown phlem at times in the AM it is green, wheezing but not bad, slight chills/sweats at bedtime. Denies any fever, no chest tx. Not able to sleep d/t coughing. All going on x 2 months. Waxes and wanes. Worse for last 2 weeks.  No hemoptysis. Good appetite w/ no n/v/d.    Review of Systems Constitutional:   No  weight loss, night sweats,  Fevers, chills, fatigue, or  lassitude.  HEENT:   No headaches,  Difficulty swallowing,  Tooth/dental problems, or  Sore throat,                No sneezing, itching, ear ache,   CV:  No chest pain,  Orthopnea, PND, swelling in lower extremities, anasarca, dizziness, palpitations, syncope.   GI  No heartburn, indigestion, abdominal pain, nausea, vomiting, diarrhea, change in bowel habits, loss of appetite, bloody stools.   Resp:   No coughing up of blood. No chest wall deformity  Skin: no rash or lesions.  GU: no dysuria, change in color of urine, no urgency or frequency.  No flank pain, no hematuria   MS:  No joint pain or swelling.  No decreased range of motion.  No back pain.  Psych:  No change in mood or affect. No depression or anxiety.  No memory loss.         Objective:   Physical Exam GEN: A/Ox3; pleasant , NAD, well nourished   HEENT:  [place]/AT,  EACs-clear, TMs-wnl, NOSE-clear, THROAT-clear, no lesions, no postnasal drip or exudate noted.   NECK:  Supple w/ fair ROM; no JVD; normal carotid impulses w/o bruits; no thyromegaly or nodules palpated; no lymphadenopathy.  RESP Diminished BS in bases, few rhonchi no accessory muscle use, no dullness to percussion  CARD:  RRR, no m/r/g  , no peripheral edema, pulses intact, no cyanosis or clubbing.  GI:   Soft & nt; nml bowel sounds; no organomegaly or masses  detected.  Musco: Warm bil, no deformities or joint swelling noted.   Neuro: alert, no focal deficits noted.    Skin: Warm, no lesions or rashes         Assessment & Plan:

## 2014-01-01 NOTE — Telephone Encounter (Signed)
Result Notes    Notes Recorded by Julio Sicksammy S Parrett, NP on 12/31/2013 at 10:00 PM No sign of PNA  Cont w/ ov recs  Please contact office for sooner follow up if symptoms do not improve or worsen or seek emergency care    Pt aware of results. No questions or concerns at this time.

## 2014-01-08 ENCOUNTER — Telehealth: Payer: Self-pay | Admitting: Critical Care Medicine

## 2014-01-08 MED ORDER — MOMETASONE FURO-FORMOTEROL FUM 200-5 MCG/ACT IN AERO: 2.0000 | INHALATION_SPRAY | Freq: Two times a day (BID) | RESPIRATORY_TRACT | Status: DC

## 2014-01-08 NOTE — Telephone Encounter (Signed)
Spoke with pt. He wanted RX to go to the [facility]. RX has been sent

## 2014-01-08 NOTE — Telephone Encounter (Signed)
Ok to [facility] to [facility] two puff twice daily

## 2014-01-08 NOTE — Telephone Encounter (Signed)
Pt came into [facility] office today with form from [facility].  Form states Advair cannot be supplied to pt because "it is currently unavailable because of your Medicare Part D Status."  Pt states [facility] has advised him that they would be able to provide him with [facility] in place of Advair.  Pt has concerns about going with [facility] d/t hearing it is for COPD not asthma on TV.  He would like to know if Dr. Delford FieldWright believes the [facility] is an option for him.  If not, he is requesting we call [facility] at [phone] to discuss with them medications they will be able to provide to him.  Dr. Delford FieldWright, pls advise on [facility].  Thank you.   Note:  Pt states he currently has approx 1 month of Advair left.

## 2014-01-11 ENCOUNTER — Telehealth: Payer: Self-pay | Admitting: Critical Care Medicine

## 2014-01-11 MED ORDER — MONTELUKAST SODIUM 10 MG PO TABS: ORAL_TABLET | ORAL | Status: DC

## 2014-01-11 MED ORDER — MOMETASONE FURO-FORMOTEROL FUM 200-5 MCG/ACT IN AERO: 2.0000 | INHALATION_SPRAY | Freq: Two times a day (BID) | RESPIRATORY_TRACT | Status: DC

## 2014-01-11 NOTE — Telephone Encounter (Signed)
Dulera rx was signed by Dr. Delford FieldWright.  It has been faxed to below fax #.

## 2014-01-11 NOTE — Telephone Encounter (Signed)
Pt aware rx has been sent to the pharm for singulair.  He also needed dulera sent to [phone].  Rx printed and will fax once signed.  Please advise crystal once done. Pt does not need a call back thanks

## 2014-01-11 NOTE — Telephone Encounter (Signed)
Pt has returned call. °

## 2014-01-11 NOTE — Telephone Encounter (Signed)
lmomtcb x1 

## 2014-01-15 ENCOUNTER — Telehealth: Payer: Self-pay | Admitting: Critical Care Medicine

## 2014-01-15 MED ORDER — TIOTROPIUM BROMIDE MONOHYDRATE 18 MCG IN CAPS: ORAL_CAPSULE | RESPIRATORY_TRACT | Status: DC

## 2014-01-15 NOTE — Telephone Encounter (Signed)
[facility] on a "Walk in Patient" form stating pt is requesting a 1 month supply of spiriva samples. I ATC pt on the #s he provided on this form, [phone], and [phone], and had to lmomtcb to verify this fax. Pt comes to [facility] office. As I am currently in [facility] office this morning, I have placed 2 spiriva samples at the [facility] front office for pt to pick up.

## 2014-01-15 NOTE — Telephone Encounter (Signed)
Pt aware.

## 2014-01-17 NOTE — Telephone Encounter (Signed)
I spoke with pt.  Confirmed he was requesting spiriva samples to be left in [facility] for pick up.  This was correct, pt has already picked samples up, and voiced no further questions or concerns at this time.

## 2014-01-24 ENCOUNTER — Telehealth: Payer: Self-pay | Admitting: Critical Care Medicine

## 2014-01-24 ENCOUNTER — Other Ambulatory Visit: Payer: Self-pay | Admitting: Critical Care Medicine

## 2014-01-24 MED ORDER — MOMETASONE FURO-FORMOTEROL FUM 200-5 MCG/ACT IN AERO: 2.0000 | INHALATION_SPRAY | Freq: Two times a day (BID) | RESPIRATORY_TRACT | Status: DC

## 2014-01-24 NOTE — Telephone Encounter (Signed)
Called, spoke with pt.  We have received a form from [facility] [place].  Nothing about Daliresp.  I have completed the form which is also requesting a signed 90 day dulera rx.  Rx must be signed by MD - will not accept stamps.  I have printed this and placed in PW's folder to sign next wk when he returns to office.  Pt is aware. Form and rx will be mailed to [facility] after rx is signed per request on [facility] form.   Note:  Advised pt we haven't received anything about Daliresp.  Pt was unsure if something was needed for this as well or only the [facility]ulera.  Pt states he will cal [facility] to verify and will have them contact our office if anything is needed on Daliresp.

## 2014-01-24 NOTE — Telephone Encounter (Signed)
Spoke with pt. He states that he is trying to get his prescriptions cheaper through [facility]. They state that they faxed us forms that needed to be filled out for [facility] and Daliresp. Advised pt that I would get this message to [name] to see if she has received these forms.  [name] have you seen these forms?

## 2014-01-30 NOTE — Telephone Encounter (Signed)
Dulera rx signed by Dr. Delford FieldWright.  I have placed this in the mail to [facility] along with the update application and requested information.  Pt aware and voiced no further questions or concerns at this time.

## 2014-02-13 ENCOUNTER — Encounter (INDEPENDENT_AMBULATORY_CARE_PROVIDER_SITE_OTHER): Payer: Self-pay

## 2014-02-13 ENCOUNTER — Inpatient Hospital Stay (HOSPITAL_COMMUNITY): Payer: Medicare Other

## 2014-02-13 ENCOUNTER — Ambulatory Visit (INDEPENDENT_AMBULATORY_CARE_PROVIDER_SITE_OTHER): Payer: Medicare Other | Admitting: Critical Care Medicine

## 2014-02-13 ENCOUNTER — Telehealth: Payer: Self-pay | Admitting: Critical Care Medicine

## 2014-02-13 ENCOUNTER — Encounter (HOSPITAL_COMMUNITY): Payer: Self-pay

## 2014-02-13 ENCOUNTER — Inpatient Hospital Stay (HOSPITAL_COMMUNITY)
Admission: AD | Admit: 2014-02-13 | Discharge: 2014-02-15 | DRG: 192 | Disposition: A | Discharge status: Home / Self Care | Payer: Medicare Other | Source: Ambulatory Visit | Attending: Critical Care Medicine | Admitting: Critical Care Medicine

## 2014-02-13 ENCOUNTER — Encounter: Payer: Self-pay | Admitting: Critical Care Medicine

## 2014-02-13 VITALS — BP 138/66 | HR 96 | Temp 98.3°F | Ht 67.0 in | Wt 232.6 lb

## 2014-02-13 DIAGNOSIS — Z860100 Personal history of colon polyps, unspecified: Secondary | ICD-10-CM

## 2014-02-13 DIAGNOSIS — N320 Bladder-neck obstruction: Secondary | ICD-10-CM

## 2014-02-13 DIAGNOSIS — J328 Other chronic sinusitis: Secondary | ICD-10-CM

## 2014-02-13 DIAGNOSIS — J441 Chronic obstructive pulmonary disease with (acute) exacerbation: Secondary | ICD-10-CM

## 2014-02-13 DIAGNOSIS — J449 Chronic obstructive pulmonary disease, unspecified: Secondary | ICD-10-CM

## 2014-02-13 DIAGNOSIS — K219 Gastro-esophageal reflux disease without esophagitis: Secondary | ICD-10-CM

## 2014-02-13 DIAGNOSIS — I10 Essential (primary) hypertension: Secondary | ICD-10-CM | POA: Diagnosis present

## 2014-02-13 DIAGNOSIS — J45901 Unspecified asthma with (acute) exacerbation: Principal | ICD-10-CM

## 2014-02-13 DIAGNOSIS — Z87891 Personal history of nicotine dependence: Secondary | ICD-10-CM

## 2014-02-13 DIAGNOSIS — E119 Type 2 diabetes mellitus without complications: Secondary | ICD-10-CM | POA: Diagnosis present

## 2014-02-13 DIAGNOSIS — E785 Hyperlipidemia, unspecified: Secondary | ICD-10-CM

## 2014-02-13 DIAGNOSIS — J019 Acute sinusitis, unspecified: Secondary | ICD-10-CM

## 2014-02-13 DIAGNOSIS — D509 Iron deficiency anemia, unspecified: Secondary | ICD-10-CM

## 2014-02-13 DIAGNOSIS — Z79899 Other long term (current) drug therapy: Secondary | ICD-10-CM

## 2014-02-13 LAB — CBC WITH DIFFERENTIAL/PLATELET
Basophils Absolute: 0.1 10*3/uL (ref 0.0–0.1)
Basophils Relative: 1 % (ref 0–1)
EOS ABS: 0.8 10*3/uL — AB (ref 0.0–0.7)
EOS PCT: 11 % — AB (ref 0–5)
HEMATOCRIT: 32.7 % — AB (ref 39.0–52.0)
Hemoglobin: 10.6 g/dL — ABNORMAL LOW (ref 13.0–17.0)
Lymphocytes Relative: 27 % (ref 12–46)
Lymphs Abs: 2 10*3/uL (ref 0.7–4.0)
MCH: 28.5 pg (ref 26.0–34.0)
MCHC: 32.4 g/dL (ref 30.0–36.0)
MCV: 87.9 fL (ref 78.0–100.0)
Monocytes Absolute: 0.6 10*3/uL (ref 0.1–1.0)
Monocytes Relative: 8 % (ref 3–12)
Neutro Abs: 3.9 10*3/uL (ref 1.7–7.7)
Neutrophils Relative %: 53 % (ref 43–77)
PLATELETS: 196 10*3/uL (ref 150–400)
RBC: 3.72 MIL/uL — ABNORMAL LOW (ref 4.22–5.81)
RDW: 13.5 % (ref 11.5–15.5)
WBC: 7.4 10*3/uL (ref 4.0–10.5)

## 2014-02-13 LAB — GLUCOSE, CAPILLARY
GLUCOSE-CAPILLARY: 111 mg/dL — AB (ref 70–99)
GLUCOSE-CAPILLARY: 146 mg/dL — AB (ref 70–99)
Glucose-Capillary: 266 mg/dL — ABNORMAL HIGH (ref 70–99)

## 2014-02-13 LAB — COMPREHENSIVE METABOLIC PANEL
ALK PHOS: 69 U/L (ref 39–117)
ALT: 40 U/L (ref 0–53)
AST: 32 U/L (ref 0–37)
Albumin: 4 g/dL (ref 3.5–5.2)
BUN: 17 mg/dL (ref 6–23)
CO2: 27 meq/L (ref 19–32)
Calcium: 9.9 mg/dL (ref 8.4–10.5)
Chloride: 98 mEq/L (ref 96–112)
Creatinine, Ser: 1.05 mg/dL (ref 0.50–1.35)
GFR, EST AFRICAN AMERICAN: 83 mL/min — AB (ref >90)
GFR, EST NON AFRICAN AMERICAN: 71 mL/min — AB (ref >90)
GLUCOSE: 112 mg/dL — AB (ref 70–99)
POTASSIUM: 3.7 meq/L (ref 3.7–5.3)
SODIUM: 137 meq/L (ref 137–147)
Total Bilirubin: 0.4 mg/dL (ref 0.3–1.2)
Total Protein: 7 g/dL (ref 6.0–8.3)

## 2014-02-13 LAB — TROPONIN I: Troponin I: <0.3 ng/mL (ref <0.30)

## 2014-02-13 LAB — EXPECTORATED SPUTUM ASSESSMENT W GRAM STAIN, RFLX TO RESP C

## 2014-02-13 LAB — PROCALCITONIN: Procalcitonin: <0.1 ng/mL

## 2014-02-13 LAB — MAGNESIUM: Magnesium: 1.7 mg/dL (ref 1.5–2.5)

## 2014-02-13 LAB — EXPECTORATED SPUTUM ASSESSMENT W REFEX TO RESP CULTURE

## 2014-02-13 LAB — PHOSPHORUS: Phosphorus: 3 mg/dL (ref 2.3–4.6)

## 2014-02-13 MED ORDER — ROFLUMILAST 500 MCG PO TABS: ORAL_TABLET | ORAL | Status: DC

## 2014-02-13 MED ORDER — GUAIFENESIN-DM 100-10 MG/5ML PO SYRP
5.0000 mL | ORAL_SOLUTION | ORAL | Status: DC | PRN
  Administered 2014-02-13 – 2014-02-14 (×4): 5 mL via ORAL
  Filled 2014-02-13 (×4): qty 10

## 2014-02-13 MED ORDER — INSULIN ASPART 100 UNIT/ML ~~LOC~~ SOLN
0.0000 [IU] | Freq: Three times a day (TID) | SUBCUTANEOUS | Status: DC
  Administered 2014-02-13: 2 [IU] via SUBCUTANEOUS
  Administered 2014-02-14 (×2): 8 [IU] via SUBCUTANEOUS
  Administered 2014-02-14: 5 [IU] via SUBCUTANEOUS
  Administered 2014-02-15: 11 [IU] via SUBCUTANEOUS

## 2014-02-13 MED ORDER — MONTELUKAST SODIUM 10 MG PO TABS
10.0000 mg | ORAL_TABLET | Freq: Every day | ORAL | Status: DC
  Administered 2014-02-14 – 2014-02-15 (×2): 10 mg via ORAL
  Filled 2014-02-13 (×2): qty 1

## 2014-02-13 MED ORDER — ATORVASTATIN CALCIUM 40 MG PO TABS
40.0000 mg | ORAL_TABLET | Freq: Every day | ORAL | Status: DC
  Administered 2014-02-14 – 2014-02-15 (×2): 40 mg via ORAL
  Filled 2014-02-13 (×2): qty 1

## 2014-02-13 MED ORDER — HYDROCHLOROTHIAZIDE 25 MG PO TABS
25.0000 mg | ORAL_TABLET | Freq: Every day | ORAL | Status: DC
  Administered 2014-02-14 – 2014-02-15 (×2): 25 mg via ORAL
  Filled 2014-02-13 (×2): qty 1

## 2014-02-13 MED ORDER — EZETIMIBE 10 MG PO TABS
10.0000 mg | ORAL_TABLET | Freq: Every day | ORAL | Status: DC
  Administered 2014-02-14 – 2014-02-15 (×2): 10 mg via ORAL
  Filled 2014-02-13 (×2): qty 1

## 2014-02-13 MED ORDER — ALBUTEROL SULFATE (2.5 MG/3ML) 0.083% IN NEBU: 2.5000 mg | INHALATION_SOLUTION | Freq: Four times a day (QID) | RESPIRATORY_TRACT | Status: DC

## 2014-02-13 MED ORDER — ROFLUMILAST 500 MCG PO TABS
500.0000 ug | ORAL_TABLET | Freq: Every day | ORAL | Status: DC
  Administered 2014-02-14 – 2014-02-15 (×2): 500 ug via ORAL
  Filled 2014-02-13 (×4): qty 1

## 2014-02-13 MED ORDER — SALINE SPRAY 0.65 % NA SOLN
2.0000 | Freq: Four times a day (QID) | NASAL | Status: DC
  Administered 2014-02-13 – 2014-02-15 (×8): 2 via NASAL
  Filled 2014-02-13 (×2): qty 44

## 2014-02-13 MED ORDER — DEXTROSE-NACL 5-0.45 % IV SOLN
INTRAVENOUS | Status: DC
  Administered 2014-02-13 – 2014-02-15 (×4): via INTRAVENOUS

## 2014-02-13 MED ORDER — OLMESARTAN MEDOXOMIL-HCTZ 40-25 MG PO TABS: 1.0000 | ORAL_TABLET | Freq: Every day | ORAL | Status: DC

## 2014-02-13 MED ORDER — HYDROCODONE-ACETAMINOPHEN 5-325 MG PO TABS: 1.0000 | ORAL_TABLET | ORAL | Status: DC | PRN

## 2014-02-13 MED ORDER — HEPARIN SODIUM (PORCINE) 5000 UNIT/ML IJ SOLN
5000.0000 [IU] | Freq: Three times a day (TID) | INTRAMUSCULAR | Status: DC
  Administered 2014-02-13 – 2014-02-15 (×6): 5000 [IU] via SUBCUTANEOUS
  Filled 2014-02-13 (×9): qty 1

## 2014-02-13 MED ORDER — FLUTICASONE PROPIONATE 50 MCG/ACT NA SUSP
2.0000 | Freq: Two times a day (BID) | NASAL | Status: DC
  Administered 2014-02-13 – 2014-02-15 (×5): 2 via NASAL
  Filled 2014-02-13: qty 16

## 2014-02-13 MED ORDER — TRAZODONE HCL 50 MG PO TABS
50.0000 mg | ORAL_TABLET | Freq: Every day | ORAL | Status: DC
  Administered 2014-02-13: 50 mg via ORAL
  Administered 2014-02-14: 100 mg via ORAL
  Filled 2014-02-13 (×3): qty 2

## 2014-02-13 MED ORDER — ALBUTEROL SULFATE (2.5 MG/3ML) 0.083% IN NEBU
2.5000 mg | INHALATION_SOLUTION | RESPIRATORY_TRACT | Status: DC | PRN
  Administered 2014-02-13 – 2014-02-14 (×3): 2.5 mg via RESPIRATORY_TRACT
  Filled 2014-02-13 (×3): qty 3

## 2014-02-13 MED ORDER — TRAMADOL HCL 50 MG PO TABS: 50.0000 mg | ORAL_TABLET | Freq: Four times a day (QID) | ORAL | Status: DC | PRN

## 2014-02-13 MED ORDER — ARFORMOTEROL TARTRATE 15 MCG/2ML IN NEBU
15.0000 ug | INHALATION_SOLUTION | Freq: Two times a day (BID) | RESPIRATORY_TRACT | Status: DC
  Administered 2014-02-13 – 2014-02-15 (×4): 15 ug via RESPIRATORY_TRACT
  Filled 2014-02-13 (×6): qty 2

## 2014-02-13 MED ORDER — PANTOPRAZOLE SODIUM 40 MG IV SOLR
40.0000 mg | Freq: Two times a day (BID) | INTRAVENOUS | Status: DC
  Administered 2014-02-13 (×2): 40 mg via INTRAVENOUS
  Filled 2014-02-13 (×4): qty 40

## 2014-02-13 MED ORDER — AMLODIPINE BESYLATE 10 MG PO TABS
10.0000 mg | ORAL_TABLET | Freq: Every day | ORAL | Status: DC
  Administered 2014-02-14 – 2014-02-15 (×2): 10 mg via ORAL
  Filled 2014-02-13 (×2): qty 1

## 2014-02-13 MED ORDER — HYDROCODONE-HOMATROPINE 5-1.5 MG/5ML PO SYRP: 5.0000 mL | ORAL_SOLUTION | Freq: Four times a day (QID) | ORAL | Status: DC | PRN

## 2014-02-13 MED ORDER — METHYLPREDNISOLONE SODIUM SUCC 125 MG IJ SOLR
60.0000 mg | Freq: Four times a day (QID) | INTRAMUSCULAR | Status: DC
  Administered 2014-02-13 – 2014-02-15 (×8): 60 mg via INTRAVENOUS
  Filled 2014-02-13 (×12): qty 0.96

## 2014-02-13 MED ORDER — LEVOFLOXACIN IN D5W 750 MG/150ML IV SOLN
750.0000 mg | INTRAVENOUS | Status: DC
  Administered 2014-02-13 – 2014-02-14 (×2): 750 mg via INTRAVENOUS
  Filled 2014-02-13 (×3): qty 150

## 2014-02-13 MED ORDER — ESCITALOPRAM OXALATE 10 MG PO TABS
10.0000 mg | ORAL_TABLET | Freq: Every day | ORAL | Status: DC
  Administered 2014-02-14 – 2014-02-15 (×2): 10 mg via ORAL
  Filled 2014-02-13 (×2): qty 1

## 2014-02-13 MED ORDER — HYDROCODONE-ACETAMINOPHEN 5-325 MG PO TABS
1.0000 | ORAL_TABLET | ORAL | Status: DC | PRN
  Administered 2014-02-13 – 2014-02-14 (×4): 2 via ORAL
  Filled 2014-02-13 (×4): qty 2

## 2014-02-13 MED ORDER — LATANOPROST 0.005 % OP SOLN
1.0000 [drp] | Freq: Every day | OPHTHALMIC | Status: DC
  Administered 2014-02-14: 1 [drp] via OPHTHALMIC
  Filled 2014-02-13: qty 2.5

## 2014-02-13 MED ORDER — ONDANSETRON HCL 4 MG PO TABS: 4.0000 mg | ORAL_TABLET | Freq: Four times a day (QID) | ORAL | Status: DC | PRN

## 2014-02-13 MED ORDER — ONDANSETRON HCL 4 MG/2ML IJ SOLN: 4.0000 mg | Freq: Four times a day (QID) | INTRAMUSCULAR | Status: DC | PRN

## 2014-02-13 MED ORDER — OXYMETAZOLINE HCL 0.05 % NA SOLN
2.0000 | Freq: Two times a day (BID) | NASAL | Status: AC
  Administered 2014-02-13 – 2014-02-14 (×3): 2 via NASAL
  Filled 2014-02-13 (×2): qty 15

## 2014-02-13 MED ORDER — BENZONATATE 100 MG PO CAPS
200.0000 mg | ORAL_CAPSULE | Freq: Three times a day (TID) | ORAL | Status: DC
  Administered 2014-02-13 – 2014-02-15 (×5): 200 mg via ORAL
  Filled 2014-02-13 (×9): qty 2

## 2014-02-13 MED ORDER — LORATADINE 10 MG PO TABS
10.0000 mg | ORAL_TABLET | Freq: Every day | ORAL | Status: DC
  Administered 2014-02-13 – 2014-02-15 (×3): 10 mg via ORAL
  Filled 2014-02-13 (×3): qty 1

## 2014-02-13 MED ORDER — TAMSULOSIN HCL 0.4 MG PO CAPS
0.4000 mg | ORAL_CAPSULE | Freq: Every day | ORAL | Status: DC
  Administered 2014-02-14 – 2014-02-15 (×2): 0.4 mg via ORAL
  Filled 2014-02-13 (×2): qty 1

## 2014-02-13 MED ORDER — BUDESONIDE 0.5 MG/2ML IN SUSP
0.5000 mg | Freq: Two times a day (BID) | RESPIRATORY_TRACT | Status: DC
  Administered 2014-02-13 – 2014-02-15 (×4): 0.5 mg via RESPIRATORY_TRACT
  Filled 2014-02-13 (×6): qty 2

## 2014-02-13 MED ORDER — ALBUTEROL SULFATE (2.5 MG/3ML) 0.083% IN NEBU
2.5000 mg | INHALATION_SOLUTION | RESPIRATORY_TRACT | Status: AC
  Administered 2014-02-13 (×3): 2.5 mg via RESPIRATORY_TRACT
  Filled 2014-02-13 (×3): qty 3

## 2014-02-13 MED ORDER — IRBESARTAN 300 MG PO TABS
300.0000 mg | ORAL_TABLET | Freq: Every day | ORAL | Status: DC
  Administered 2014-02-14 – 2014-02-15 (×2): 300 mg via ORAL
  Filled 2014-02-13 (×2): qty 1

## 2014-02-13 NOTE — Progress Notes (Signed)
   Subjective:    Patient ID: [name], male    DOB: Mar 01, 1946, 68 y.o.   MRN: [id number]  HPI  68 yo male with known hx of COPD   12/27/13 Acute OV  Pt presents for an acute ov; He complains of  Complains of prod cough brown phlem at times in the AM it is green, wheezing but not bad, slight chills/sweats at bedtime. Denies any fever, no chest tx. Not able to sleep d/t coughing. All going on x 2 months. Waxes and wanes. Worse for last 2 weeks.  No hemoptysis. Good appetite w/ no n/v/d.    02/13/2014 Chief Complaint  Patient presents with  . Acute Visit    o2 sat 88% on RA upon arrival to exam room.  Prod cough with green mucus x 3 wk, increased SOB, wheezing, and chest tightness.  No fever.  Peak Flow reading 150 this am.  Pt will for one week, started with coughing. Pt saw TP one month ago, did ok but worse with meds off. No fever, mucus is green and brown.  Notes some chest tightness, no pain. Ribs sore from coughing .  Notes sinuses congested, coughs all night .  No hemoptysis except one time before 12/2013 ov   Review of Systems  Constitutional:   No  weight loss, night sweats,  Fevers, chills, fatigue, or  lassitude.  HEENT:   No headaches,  Difficulty swallowing,  Tooth/dental problems, or  Sore throat,                No sneezing, itching, ear ache,   CV:  No chest pain,  Orthopnea, PND, swelling in lower extremities, anasarca, dizziness, palpitations, syncope.   GI  No heartburn, indigestion, abdominal pain, nausea, vomiting, diarrhea, change in bowel habits, loss of appetite, bloody stools.   Resp:   No coughing up of blood. No chest wall deformity  Skin: no rash or lesions.  GU: no dysuria, change in color of urine, no urgency or frequency.  No flank pain, no hematuria   MS:  No joint pain or swelling.  No decreased range of motion.  No back pain.  Psych:  No change in mood or affect. No depression or anxiety.  No memory loss.         Objective:   Physical Exam BP 138/66  Pulse 96  Temp(Src) 98.3 F (36.8 C) (Oral)  Ht 5\' 7"  (1.702 m)  Wt 232 lb 9.6 oz (105.507 kg)  BMI 36.42 kg/m2  SpO2 92%  GEN: A/Ox3; pleasant , NAD, well nourished  Incessant cough  HEENT:  [place]/AT,  EACs-clear, TMs-wnl, NOSE-clear, THROAT-clear, no lesions, bilateral nares purulence  NECK:  Supple w/ fair ROM; no JVD; normal carotid impulses w/o bruits; no thyromegaly or nodules palpated; no lymphadenopathy.  RESP insp/exp wheezes , poor airflow CARD:  RRR, no m/r/g  , no peripheral edema, pulses intact, no cyanosis or clubbing.  GI:   Soft & nt; nml bowel sounds; no organomegaly or masses detected.  Musco: Warm bil, no deformities or joint swelling noted.   Neuro: alert, no focal deficits noted.    Skin: Warm, no lesions or rashes         Assessment & Plan:  Copd exac with sinusitis and acute resp failure  Plan Admit for IV and BD neb Rx See H and P.

## 2014-02-13 NOTE — Telephone Encounter (Signed)
Spoke with pt. appt scheduled for him to come in and see PW today. Nothing further needed

## 2014-02-13 NOTE — H&P (Signed)
Name: [name] MRN: [id number] DOB: Nov 02, 1945    ADMISSION DATE:  02/13/2014  PRIMARY SERVICE:  pccm  CHIEF COMPLAINT:  Dyspnea, cough, sinus conestion   BRIEF PATIENT DESCRIPTION:  68 yom f/b PW for COPD (GOLD C) w/ freq asthmatic bronchitis flares. Presented to office as acute visit 4/22 w/ sinus congestion, PND, HA,  productive cough, wheeze, SOB. O2 sats 88% Peak Flow 150 L/min. Sent to [facility] for direct admission w/ working dx of sinusitis and AECOPD.  SIGNIFICANT EVENTS / STUDIES:    LINES / TUBES:   CULTURES: Sputum 4/22>>>  ANTIBIOTICS: levaquin 4/22>>>  HISTORY OF PRESENT ILLNESS:   68 year old male f/b Dr Delford Field for COPD w/ asthmatic component, GOLD C. Last seen in our office 3/5  at that time c/o cough productive brown to green sputum which waxed and waned, w/ occ chills and sweats at bedtime. At this time he was treated in usual fashion for AECOPD which included: augmentin, pred taper and mucinex. Initially felt better. Presented to our office again as sick visit on 4/22 w/ CC: 1 week h/o sinus congestion, worsening cough (more frequent and still productive green/brown), + chest tightness, associated rib pain from coughing, + SOB. O2 sats 88% w/ peak flow in office at 150L/min. Sent to [facility] for direct admit.   PAST MEDICAL HISTORY :  Past Medical History  Diagnosis Date  . Thigh pain   . Anemia, unspecified   . Bronchitis, chronic obstructive, with exacerbation   . Hypertension   . Hyperlipidemia   . GERD (gastroesophageal reflux disease)   . Diabetes mellitus type II   . 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 History of colonic polyps   . Diverticulosis   . Hemorrhoids    Past Surgical History  Procedure Laterality Date  . Esophagogastroduodenoscopy  04/25/2002    Prior to Admission medications   Medication Sig Start Date End Date Taking? Authorizing Provider  amLODipine (NORVASC) 10 MG tablet  Take by mouth Daily. 10/01/11   Historical Provider, MD  escitalopram (LEXAPRO) 10 MG tablet Take 1 tablet by mouth daily.    Historical Provider, MD  HYDROcodone-homatropine (HYDROMET) 5-1.5 MG/5ML syrup Take 5 mLs by mouth every 6 (six) hours as needed for cough. 12/27/13   Tammy S Parrett, NP  mometasone-formoterol (DULERA) 200-5 MCG/ACT AERO Inhale 2 puffs into the lungs 2 (two) times daily. 01/24/14   Storm Frisk, MD  montelukast (SINGULAIR) 10 MG tablet TAKE 1 TABLET DAILY 01/11/14   Storm Frisk, MD  Multiple Vitamin (MULTIVITAMIN) tablet Take 1 tablet by mouth daily.    Historical Provider, MD  olmesartan-hydrochlorothiazide (BENICAR HCT) 40-25 MG per tablet Take 1 tablet by mouth daily.    Historical Provider, MD  Omega-3 Fatty Acids (FISH OIL) 1200 MG CAPS Take by mouth daily.    Historical Provider, MD  pantoprazole (PROTONIX) 20 MG tablet Take 20 mg by mouth daily.    Historical Provider, MD  roflumilast (DALIRESP) 500 MCG TABS tablet TAKE 1 TABLET BY MOUTH DAILY. 02/13/14   Storm Frisk, MD  simvastatin (ZOCOR) 80 MG tablet Take 1 tablet by mouth daily.    Historical Provider, MD  Tamsulosin HCl (FLOMAX) 0.4 MG CAPS Take 0.4 mg by mouth daily.      Historical Provider, MD  tiotropium (SPIRIVA HANDIHALER) 18 MCG inhalation capsule INHALE THE CONTENTS OF 1 CAPSULE ONCE DAILY AS DIRECTED IN THE PACKAGE 01/15/14   Charlcie Cradle  Delford FieldWright, MD  TRAVATAN Z 0.004 % SOLN ophthalmic solution Place 1 drop into both eyes Daily.  02/03/12   Historical Provider, MD  valACYclovir (VALTREX) 500 MG tablet as needed.  07/02/11   Historical Provider, MD  VENTOLIN HFA 108 (90 BASE) MCG/ACT inhaler INHALE 2 PUFFS INTO THE LUNGS EVERY 4 HOURS AS NEEDED 06/10/13   Storm FriskPatrick E Nizhoni Parlow, MD  ZETIA 10 MG tablet Take 1 tablet by mouth daily.    Historical Provider, MD   No Known Allergies  FAMILY HISTORY:  Family History  Problem Relation Age of Onset  . Ulcers    . Throat cancer Father    SOCIAL HISTORY:   reports that he quit smoking about 43 years ago. His smoking use included Cigarettes. He has a 5 pack-year smoking history. He has never used smokeless tobacco. He reports that he drinks alcohol. His drug history is not on file.  Review of Systems:   Bolds are positive  Constitutional: weight loss, gain, night sweats, Fevers, chills, fatigue .  HEENT: headaches, Sore throat, sneezing, nasal congestion, post nasal dr, for last 2 weeks, Difficulty swallowing, Tooth/dental problems, visual complaints visual changes, ear ache CV:  chest pain, ribs w/ cough radiates: ,Orthopnea, PND, swelling in lower extremities, dizziness, palpitations, syncope.  GI  heartburn, indigestion, abdominal pain, nausea, vomiting, diarrhea, change in bowel habits, loss of appetite, bloody stools.  Resp: cough, productive: green/brown: , hemoptysis, dyspnea, associated w/ cough chest pain, pleuritic.  Skin: rash or itching or icterus GU: dysuria, change in color of urine, urgency or frequency. flank pain, hematuria  MS: joint pain or swelling. decreased range of motion  Psych: change in mood or affect. depression or anxiety.  Neuro: difficulty with speech, weakness, numbness, ataxia    SUBJECTIVE:  Coughing, very SOB w/ any exertion   VITAL SIGNS: Temp:  [98.3 F (36.8 C)] 98.3 F (36.8 C) (04/22 1038) Pulse Rate:  [96] 96 (04/22 1038) BP: (138)/(66) 138/66 mmHg (04/22 1038) SpO2:  [88 %-92 %] 92 % (04/22 1038) Weight:  [105.507 kg (232 lb 9.6 oz)] 105.507 kg (232 lb 9.6 oz) (04/22 1038)  PHYSICAL EXAMINATION: General:  Well nourished 68 year old AAM, currently w/ intermittent distress due to freq cough. However is comfortable when not coughing   Neuro:  No focal def  HEENT:  Phonation hoarse, + nares w/ clear d/c, + drainage post pharynx  Cardiovascular:  rrr Lungs:  Diffuse wheeze  Abdomen:  Soft, non-tender + bowel sounds  Musculoskeletal:  Intact  Skin:  Intact   No results found for this basename: NA,  K, CL, CO2, BUN, CREATININE, GLUCOSE,  in the last 168 hours No results found for this basename: HGB, HCT, WBC, PLT,  in the last 168 hours No results found.  ASSESSMENT / PLAN: AECOPD  W/ Bronchospasm in setting of Acute sinusitis w/ purulent bronchitis   Plan Admit to med/surg Get CXR, PCT, sputum if able  Supplemental O2 Scheduled BDs, start w/ q 2 albuterol X 3 doses, then switch to brovana/budesonide w/ planned PRN albut Will cont Daliresp Hold Spiriva (for now), given his cough doubt he can do this effectively anyway Systemic steroids Empiric levaquin  Nasal hygiene (saline-->afrin-->flonase) Add antihistamine  Cough rx w/ scheduled tessolon, and PRN vicodin OR tramadol. Also PRN robitussin DM Add flutter and IS  Add scheduled PPI in case GERD complicating   HTN Plan Cont home meds Get chem  DM Plan: Add ssi in setting of systemic steroids  Charlcie Cradleatrick E  WrightMD Beeper  [phone]  Cell  [phone]  If no response or cell goes to voicemail, call beeper [phone]  Pulmonary and Critical Care Medicine [facility] Pager: [phone]  02/13/2014, 12:16 PM

## 2014-02-13 NOTE — Progress Notes (Signed)
Pt arrived from MD's office as a direct admit. Paged MD for orders, will continue to monitor.

## 2014-02-13 NOTE — Telephone Encounter (Signed)
We received fax from [facility] on this pt for Daliresp 500 mcg.  Requesting application to be signed by PW, signed rx to be attached for 90 days with no refills, and be mailed back to [facility].  All of the above has been completed.  Application and rx placed in mail to: [facility]  [address] [place], [place] [id number] Copy placed in scan folder and copy given to pt as he was in office for OV. Pt aware this has been taken care of and voiced no further questions or concerns at this time. He is to call back if he doesn't hear from [facility] or if they do not approve him for this. He is in agreement with this plan.

## 2014-02-13 NOTE — Patient Instructions (Signed)
[facility]

## 2014-02-14 ENCOUNTER — Telehealth: Payer: Self-pay | Admitting: Critical Care Medicine

## 2014-02-14 DIAGNOSIS — J449 Chronic obstructive pulmonary disease, unspecified: Secondary | ICD-10-CM

## 2014-02-14 DIAGNOSIS — J328 Other chronic sinusitis: Secondary | ICD-10-CM

## 2014-02-14 DIAGNOSIS — E119 Type 2 diabetes mellitus without complications: Secondary | ICD-10-CM

## 2014-02-14 LAB — GLUCOSE, CAPILLARY
GLUCOSE-CAPILLARY: 254 mg/dL — AB (ref 70–99)
Glucose-Capillary: 283 mg/dL — ABNORMAL HIGH (ref 70–99)
Glucose-Capillary: 270 mg/dL — ABNORMAL HIGH (ref 70–99)
Glucose-Capillary: 222 mg/dL — ABNORMAL HIGH (ref 70–99)
Glucose-Capillary: 262 mg/dL — ABNORMAL HIGH (ref 70–99)

## 2014-02-14 LAB — BASIC METABOLIC PANEL
BUN: 17 mg/dL (ref 6–23)
CO2: 23 mEq/L (ref 19–32)
Calcium: 9.3 mg/dL (ref 8.4–10.5)
Chloride: 98 mEq/L (ref 96–112)
Creatinine, Ser: 1.16 mg/dL (ref 0.50–1.35)
GFR calc Af Amer: 73 mL/min — ABNORMAL LOW (ref >90)
GFR, EST NON AFRICAN AMERICAN: 63 mL/min — AB (ref >90)
Glucose, Bld: 278 mg/dL — ABNORMAL HIGH (ref 70–99)
POTASSIUM: 4.1 meq/L (ref 3.7–5.3)
Sodium: 136 mEq/L — ABNORMAL LOW (ref 137–147)

## 2014-02-14 LAB — CBC
HCT: 33.3 % — ABNORMAL LOW (ref 39.0–52.0)
Hemoglobin: 10.7 g/dL — ABNORMAL LOW (ref 13.0–17.0)
MCH: 28.5 pg (ref 26.0–34.0)
MCHC: 32.1 g/dL (ref 30.0–36.0)
MCV: 88.8 fL (ref 78.0–100.0)
Platelets: 193 10*3/uL (ref 150–400)
RBC: 3.75 MIL/uL — ABNORMAL LOW (ref 4.22–5.81)
RDW: 13.4 % (ref 11.5–15.5)
WBC: 8.2 10*3/uL (ref 4.0–10.5)

## 2014-02-14 LAB — TROPONIN I: Troponin I: <0.3 ng/mL (ref <0.30)

## 2014-02-14 MED ORDER — PANTOPRAZOLE SODIUM 40 MG PO TBEC
40.0000 mg | DELAYED_RELEASE_TABLET | Freq: Two times a day (BID) | ORAL | Status: DC
  Administered 2014-02-14 – 2014-02-15 (×3): 40 mg via ORAL
  Filled 2014-02-14 (×4): qty 1

## 2014-02-14 NOTE — Progress Notes (Signed)
Inpatient Diabetes Program Recommendations  AACE/ADA: New Consensus Statement on Inpatient Glycemic Control (2013)  Target Ranges:  Prepandial:   less than 140 mg/dL      Peak postprandial:   less than 180 mg/dL (1-2 hours)      Critically ill patients:  140 - 180 mg/dL   Reason for Visit: Hyperglycemia  Diabetes history: DM2 Outpatient Diabetes medications: None Current orders for Inpatient glycemic control: Novolog moderate tidwc On Solumedrol 60 mg Q6H.  Hyperglycemia in the setting of steroids.  Inpatient Diabetes Program Recommendations Correction (SSI): Increase Novolog to resistant tidwc and hs Insulin - Meal Coverage: Consider addition of meal coverage insulin while on steroids - Novolog 3 units tidwc if pt eats >50% meal HgbA1C: Check HgbA1C to assess glycemic control prior to hospitalization  Note: Will continue to follow. Thank you. [name], RD, LDN, CDE Inpatient Diabetes Coordinator [phone]

## 2014-02-14 NOTE — Telephone Encounter (Signed)
[facility]  She reports that they have faxed over forms to have PW sign on 4.21.15 Forms are for assistance for [facility] and Spir[facility]  Please advise if these were received, and if not I will have her refax  Thanks!

## 2014-02-14 NOTE — Telephone Encounter (Signed)
[facility] Spiriva have all been received and processed but the Daliresp was never received.  Per Baird LyonsCasey this will need to be mailed or refaxed to them. Fax # [phone] -------- This was printed from patients chart and refaxed to the number above x 2. Called back and Spoke with Alisha to verify that application received. This has been received and has been sent to be processed.  Will send to [name] as FYI that this has been taken are of and nothing further needed.

## 2014-02-14 NOTE — Telephone Encounter (Signed)
On 02/13/14, we received application for DALIRESP from [facility]. Pt was seen on 02/13/14 and advised we had received the daliresp application and this had been taken care of. I created a phone msg and documented that everything requested for the [facility] had been taken care of and mailed back to the address they provided. This did NOT say anything about Dulera or Spiriva.  According to phone msg from 01/24/14, [facility] along with updated application were mailed to [facility].  Did they receive the [facility] information?

## 2014-02-14 NOTE — Progress Notes (Signed)
PHARMACY BRIEF NOTE:  PROTONIX IV TO PO CONVERSION  This patient is receiving IV Protonix, which is in short supply.    Based on  conservation measures implemented by the Pharmacy and Therapeutics Committee. This patient now meets criteria for automatic switch to PO Protonix; the medication profile has been updated accordingly.  If there are questions regarding this change, please contact Pharmacy at [phone]  Thank you,   Arley Phenixllen Braedin Millhouse RPh 02/14/2014, 8:32 AM Pager [phone]

## 2014-02-14 NOTE — Progress Notes (Signed)
Pulmonary/Critical Care Progress Note  Name: [name] MRN: [id number] DOB: 11/11/1945    ADMISSION DATE:  02/13/2014  PRIMARY SERVICE:  pccm  CHIEF COMPLAINT:  Dyspnea, cough, sinus conestion   BRIEF PATIENT DESCRIPTION:  [age] yom f/b PW for COPD (GOLD C) w/ freq asthmatic bronchitis flares. Presented to office as acute visit 4/22 w/ sinus congestion, PND, HA,  productive cough, wheeze, SOB. O2 sats 88% Peak Flow 150 L/min. [facility] w/ working dx of sinusitis and AECOPD.  SIGNIFICANT EVENTS / STUDIES:    LINES / TUBES:   CULTURES: Sputum 4/22>>>  ANTIBIOTICS: levaquin 4/22>>>  SUBJECTIVE:  Coughing, SOB w/ any exertion   VITAL SIGNS: Temp:  [97.5 F (36.4 C)-98.3 F (36.8 C)] 97.7 F (36.5 C) (04/23 0502) Pulse Rate:  [69-96] 69 (04/23 0502) Resp:  [18-20] 18 (04/23 0502) BP: (115-138)/(64-70) 115/64 mmHg (04/23 0502) SpO2:  [91 %-95 %] 94 % (04/23 0838) Weight:  [232 lb 9.4 oz (105.5 kg)-232 lb 9.6 oz (105.507 kg)] 232 lb 9.4 oz (105.5 kg) (04/22 1244)  PHYSICAL EXAMINATION: General:  Well nourished 68 year old AAM, coughing but SOB improved.  Neuro:  No focal def  HEENT:  Phonation hoarse, + nares w/ clear d/c, + drainage post pharynx  Cardiovascular:  rrr Lungs:  Diffuse wheeze  Abdomen:  Soft, non-tender + bowel sounds  Musculoskeletal:  Intact  Skin:  Intact    Recent Labs Lab 02/13/14 1351 02/14/14 0115  NA 137 136*  K 3.7 4.1  CL 98 98  CO2 27 23  BUN 17 17  CREATININE 1.05 1.16  GLUCOSE 112* 278*    Recent Labs Lab 02/13/14 1351 02/14/14 0115  HGB 10.6* 10.7*  HCT 32.7* 33.3*  WBC 7.4 8.2  PLT 196 193   Portable Chest 1 View  02/13/2014   CLINICAL DATA:  Chronic obstructive bronchitis.  COPD.  EXAM: PORTABLE CHEST - 1 VIEW  COMPARISON:  12/27/2013.  FINDINGS: Cardiopericardial silhouette is upper limits of normal for projection. Low volume chest with basilar atelectasis. No airspace disease or effusion.  No pneumothorax.  IMPRESSION: Low volume chest without acute cardiopulmonary disease.   Electronically Signed   By: Andreas NewportGeoffrey  Lamke M.D.   On: 02/13/2014 14:34   ASSESSMENT / PLAN: AECOPD  W/ Bronchospasm in setting of Acute sinusitis w/ purulent bronchitis   Plan Get CXR noted, PCT<0.1, sputum if able.  Supplemental O2. Scheduled BDs, brovana/budesonide w/ planned PRN albut. Will cont Daliresp. Hold Spiriva (for now), given his cough doubt he can do this effectively anyway. Systemic steroids. Empiric levaquin, finish a five day course. Nasal hygiene (saline-->afrin-->flonase). Add antihistamine. Cough rx w/ scheduled tessolon, and PRN vicodin OR tramadol. Also PRN robitussin DM. Add flutter and IS. Add scheduled PPI in case GERD complicating.  HTN Plan Cont home meds Get chem  DM Plan: Continue ssi in setting of systemic steroids  Alyson ReedyWesam G. Yacoub, M.D. [facility]. Pager: [phone]. After hours pager: [phone].

## 2014-02-15 DIAGNOSIS — J019 Acute sinusitis, unspecified: Secondary | ICD-10-CM

## 2014-02-15 LAB — GLUCOSE, CAPILLARY: Glucose-Capillary: 310 mg/dL — ABNORMAL HIGH (ref 70–99)

## 2014-02-15 LAB — PROCALCITONIN: Procalcitonin: <0.1 ng/mL

## 2014-02-15 MED ORDER — OXYMETAZOLINE HCL 0.05 % NA SOLN: 2.0000 | Freq: Two times a day (BID) | NASAL | Status: AC

## 2014-02-15 MED ORDER — PREDNISONE 10 MG PO TABS: ORAL_TABLET | ORAL | Status: DC

## 2014-02-15 MED ORDER — SALINE SPRAY 0.65 % NA SOLN: 2.0000 | Freq: Four times a day (QID) | NASAL | Status: DC

## 2014-02-15 MED ORDER — FLUTICASONE PROPIONATE 50 MCG/ACT NA SUSP: NASAL | Status: DC

## 2014-02-15 MED ORDER — HYDROCODONE-ACETAMINOPHEN 5-325 MG PO TABS: 1.0000 | ORAL_TABLET | ORAL | Status: DC | PRN

## 2014-02-15 MED ORDER — LEVOFLOXACIN 750 MG PO TABS: 750.0000 mg | ORAL_TABLET | Freq: Every day | ORAL | Status: DC

## 2014-02-15 MED ORDER — LORATADINE 10 MG PO TABS: 10.0000 mg | ORAL_TABLET | Freq: Every day | ORAL | Status: AC

## 2014-02-15 NOTE — Discharge Summary (Signed)
Physician Discharge Summary       Patient ID: [name] MRN: [id number] DOB/AGE: Sep 18, 1946 68 y.o.  Admit date: 02/13/2014 Discharge date: 02/15/2014  Discharge Diagnoses:  Active Problems:   DIABETES MELLITUS, TYPE II   HYPERTENSION   COPD exacerbation   Acute sinusitis  Detailed Hospital Course:   68 year old male f/b Dr Delford Field for COPD w/ asthmatic component, GOLD C. Last seen in our office 3/5 at that time c/o cough productive brown to green sputum which waxed and waned, w/ occ chills and sweats at bedtime. At this time he was treated in usual fashion for AECOPD which included: augmentin, pred taper and mucinex. Initially felt better. Presented to our office again as sick visit on 4/22 w/ CC: 1 week h/o sinus congestion, worsening cough (more frequent and still productive green/brown), + chest tightness, associated rib pain from coughing, + SOB. O2 sats 88% w/ peak flow in office at 150L/min. Sent to [facility] for direct admit.   He was admitted to the med/surg unit. Therapeutic interventions included: supplemental oxygen, empiric antibiotics, scheduled bronchodilators, systemic steroids, and nasal hygiene regimen for sinus congestion. Also added antihistamine and cough suppression. He made remarkable improvement over the following 48 hours to the point he was ready for discharge as of 4/24. We walked in hallway on room air prior to discharge. He had no episodes of desaturation and tolerated this well. We felt he was stable for discharge with the plan as outlined below.     Discharge Plan by diagnoses   AECOPD W/ Bronchospasm in setting of Acute sinusitis w/ purulent bronchitis  Plan  Home on dulera, spiriva and daliresp w/ PRN albuterol Cont singulair  Home on levaquin 7 more days  Prednisone taper  Nasal hygiene (saline-->afrin-->flonase). Have instructed him he can continue the afrin for 2 more days.  Added antihistamine.  PRN mucinex DM PRN Vicodin   HTN  Plan  Cont  home meds   DM w/ hyperglycemia  CBG (last 3)   Recent Labs  02/14/14 1640 02/14/14 2228 02/15/14 0803  GLUCAP 222* 262* 310*   Plan:  Glucose should improve w/ decreased solumedrol dosing    Significant Hospital tests/ studies/ interventions and procedures  Consults  Discharge Exam: BP 146/61  Pulse 75  Temp(Src) 97.8 F (36.6 C) (Oral)  Resp 16  Ht 5\' 7"  (1.702 m)  Wt 105.5 kg (232 lb 9.4 oz)  BMI 36.42 kg/m2  SpO2 92% Room air   General: Well nourished 68 year old AAM, coughing but SOB improved.  Neuro: No focal def  HEENT: Phonation hoarse, + nares w/ clear d/c, + drainage post pharynx  Cardiovascular: rrr  Lungs: Diffuse wheeze  Abdomen: Soft, non-tender + bowel sounds  Musculoskeletal: Intact  Skin: Intact    Labs at discharge Lab Results  Component Value Date   CREATININE 1.16 02/14/2014   BUN 17 02/14/2014   NA 136* 02/14/2014   K 4.1 02/14/2014   CL 98 02/14/2014   CO2 23 02/14/2014   Lab Results  Component Value Date   WBC 8.2 02/14/2014   HGB 10.7* 02/14/2014   HCT 33.3* 02/14/2014   MCV 88.8 02/14/2014   PLT 193 02/14/2014   Lab Results  Component Value Date   ALT 40 02/13/2014   AST 32 02/13/2014   ALKPHOS 69 02/13/2014   BILITOT 0.4 02/13/2014   No results found for this basename: INR,  PROTIME    Current radiology studies Portable Chest 1 View  02/13/2014   CLINICAL DATA:  Chronic obstructive bronchitis.  COPD.  EXAM: PORTABLE CHEST - 1 VIEW  COMPARISON:  12/27/2013.  FINDINGS: Cardiopericardial silhouette is upper limits of normal for projection. Low volume chest with basilar atelectasis. No airspace disease or effusion. No pneumothorax.  IMPRESSION: Low volume chest without acute cardiopulmonary disease.   Electronically Signed   By: Andreas NewportGeoffrey  Lamke M.D.   On: 02/13/2014 14:34    Disposition: home         Discharge Orders   Future Appointments Provider Department Dept Phone   02/21/2014 9:30 AM Storm FriskPatrick E Wright, MD [facility] [phone]   Future Orders Complete By Expires   Ambulatory referral to Nutrition and Diabetic Education  As directed    Diet - low sodium heart healthy  As directed    Increase activity slowly  As directed        Medication List         albuterol 108 (90 BASE) MCG/ACT inhaler  Commonly known as:  PROVENTIL HFA;VENTOLIN HFA  Inhale 2 puffs into the lungs every 4 (four) hours as needed for wheezing or shortness of breath.     amLODipine 10 MG tablet  Commonly known as:  NORVASC  Take 10 mg by mouth Daily.     dextromethorphan-guaiFENesin 30-600 MG per 12 hr tablet  Commonly known as:  MUCINEX DM  Take 1 tablet by mouth 2 (two) times daily as needed for cough.     escitalopram 10 MG tablet  Commonly known as:  LEXAPRO  Take 1 tablet by mouth daily.     Fish Oil 1200 MG Caps  Take 1,200 mg by mouth daily.     FLOMAX 0.4 MG Caps capsule  Generic drug:  tamsulosin  Take 0.4 mg by mouth daily.     fluticasone 50 MCG/ACT nasal spray  Commonly known as:  FLONASE  2 sprays each nare twice a day for 2 more days, then continue 2 sprays each nare daily during pollen season     HYDROcodone-acetaminophen 5-325 MG per tablet  Commonly known as:  NORCO/VICODIN  Take 1-2 tablets by mouth every 4 (four) hours as needed for moderate pain (pain and or cough).     levofloxacin 750 MG tablet  Commonly known as:  LEVAQUIN  Take 1 tablet (750 mg total) by mouth daily.     loratadine 10 MG tablet  Commonly known as:  CLARITIN  Take 1 tablet (10 mg total) by mouth daily.     mometasone-formoterol 200-5 MCG/ACT Aero  Commonly known as:  DULERA  Inhale 2 puffs into the lungs 2 (two) times daily.     montelukast 10 MG tablet  Commonly known as:  SINGULAIR  Take 10 mg by mouth daily.     multivitamin tablet  Take 1 tablet by mouth daily.     naproxen sodium 220 MG tablet  Commonly known as:  ANAPROX  Take 220 mg by mouth every 12 (twelve) hours as needed (pain).      olmesartan-hydrochlorothiazide 40-25 MG per tablet  Commonly known as:  BENICAR HCT  Take 1 tablet by mouth daily.     oxymetazoline 0.05 % nasal spray  Commonly known as:  AFRIN NASAL SPRAY  Place 2 sprays into both nostrils 2 (two) times daily.     pantoprazole 20 MG tablet  Commonly known as:  PROTONIX  Take 20 mg by mouth daily.     predniSONE 10 MG tablet  Commonly  known as:  DELTASONE  Take 4 tabs  daily with food x 4 days, then 3 tabs daily x 4 days, then 2 tabs daily x 4 days, then 1 tab daily x4 days then stop. #40     roflumilast 500 MCG Tabs tablet  Commonly known as:  DALIRESP  Take 500 mcg by mouth daily.     simvastatin 80 MG tablet  Commonly known as:  ZOCOR  Take 1 tablet by mouth daily.     sodium chloride 0.65 % Soln nasal spray  Commonly known as:  OCEAN  Place 2 sprays into both nostrils 4 (four) times daily.     tiotropium 18 MCG inhalation capsule  Commonly known as:  SPIRIVA  Place 18 mcg into inhaler and inhale daily.     TRAVATAN Z 0.004 % Soln ophthalmic solution  Generic drug:  Travoprost (BAK Free)  Place 1 drop into both eyes Daily.     valACYclovir 500 MG tablet  Commonly known as:  VALTREX  Take 500 mg by mouth daily as needed (when on steroids).     ZETIA 10 MG tablet  Generic drug:  ezetimibe  Take 1 tablet by mouth daily.       Follow-up Information   Follow up with Shan LevansPatrick Wright, MD On 02/21/2014. (930am)    Specialty:  Pulmonary Disease   Contact information:   [address] [place] [address] [phone]       Discharged Condition: good  Physician Statement:   The Patient was personally examined, the discharge assessment and plan has been personally reviewed and I agree with ACNP Janaia Kozel's assessment and plan. > 30 minutes of time have been dedicated to discharge assessment, planning and discharge instructions.   Signed: Simonne Martineteter E Salwa Bai 02/15/2014, 12:18 PM

## 2014-02-15 NOTE — Discharge Instructions (Signed)
Continue your nasal hygiene regimen   Nasal saline (brand of choice)  2 puffs 4 times a day thru Saturday  In the morning and before bed continue take 2 puffs of afrin Each nare, 2 puffs (thru Saturday) this should be followed by: flonase 2 sprays each nare in morning and night thru Saturday. Then after Saturday would change flonase to DAILY, and nasal saline to As needed.

## 2014-02-16 LAB — CULTURE, RESPIRATORY

## 2014-02-16 LAB — CULTURE, RESPIRATORY W GRAM STAIN: Culture: NORMAL

## 2014-02-21 ENCOUNTER — Ambulatory Visit (INDEPENDENT_AMBULATORY_CARE_PROVIDER_SITE_OTHER): Payer: Medicare Other | Admitting: Critical Care Medicine

## 2014-02-21 ENCOUNTER — Encounter: Payer: Self-pay | Admitting: Critical Care Medicine

## 2014-02-21 VITALS — BP 126/82 | HR 68 | Temp 98.8°F | Ht 66.0 in | Wt 231.0 lb

## 2014-02-21 DIAGNOSIS — J449 Chronic obstructive pulmonary disease, unspecified: Secondary | ICD-10-CM

## 2014-02-21 NOTE — Progress Notes (Signed)
Subjective:    Patient ID: [name], male    DOB: January 28, 1946, 68 y.o.   MRN: [id number]  HPI  68 y.o.  male with known hx of COPD   02/21/2014 Chief Complaint  Patient presents with  . Hospitalization Follow-up    Breathing is back to baseline.  Occas cough - nonprod.  No SOB, wheezing, or chest tightness/pain.   Pt was in hosp last week, adm from office at last OV.  Now is better Cough and dyspnea is improved. Pt denies any significant sore throat, nasal congestion or excess secretions, fever, chills, sweats, unintended weight loss, pleurtic or exertional chest pain, orthopnea PND, or leg swelling Pt denies any increase in rescue therapy over baseline, denies waking up needing it or having any early am or nocturnal exacerbations of coughing/wheezing/or dyspnea. Pt also denies any obvious fluctuation in symptoms with  weather or environmental change or other alleviating or aggravating factors   Review of Systems  Constitutional:   No  weight loss, night sweats,  Fevers, chills, fatigue, or  lassitude.  HEENT:   No headaches,  Difficulty swallowing,  Tooth/dental problems, or  Sore throat,                No sneezing, itching, ear ache,   CV:  No chest pain,  Orthopnea, PND, swelling in lower extremities, anasarca, dizziness, palpitations, syncope.   GI  No heartburn, indigestion, abdominal pain, nausea, vomiting, diarrhea, change in bowel habits, loss of appetite, bloody stools.   Resp:   No coughing up of blood. No chest wall deformity  Skin: no rash or lesions.  GU: no dysuria, change in color of urine, no urgency or frequency.  No flank pain, no hematuria   MS:  No joint pain or swelling.  No decreased range of motion.  No back pain.  Psych:  No change in mood or affect. No depression or anxiety.  No memory loss.      Objective:   Physical Exam BP 126/82  Pulse 68  Temp(Src) 98.8 F (37.1 C) (Oral)  Ht 5\' 6"  (1.676 m)  Wt 231 lb (104.781 kg)  BMI  37.30 kg/m2  SpO2 94%  GEN: A/Ox3; pleasant , NAD, well nourished  Incessant cough  HEENT:  [place]/AT,  EACs-clear, TMs-wnl, NOSE-clear, THROAT-clear, no lesions,  NECK:  Supple w/ fair ROM; no JVD; normal carotid impulses w/o bruits; no thyromegaly or nodules palpated; no lymphadenopathy.  RESP  improved airflow CARD:  RRR, no m/r/g  , no peripheral edema, pulses intact, no cyanosis or clubbing.  GI:   Soft & nt; nml bowel sounds; no organomegaly or masses detected.  Musco: Warm bil, no deformities or joint swelling noted.   Neuro: alert, no focal deficits noted.    Skin: Warm, no lesions or rashes     Assessment & Plan:   COPD gold C. with recurrent exacerbations Gold stage C. COPD with recurrent exacerbations in recent hospitalization now improved Plan Finish levaquin  Finish prednisone When completed with advair, start dulera Stop daliresp for now Return 2 months    Updated Medication List Outpatient Encounter Prescriptions as of 02/21/2014  Medication Sig  . albuterol (PROVENTIL HFA;VENTOLIN HFA) 108 (90 BASE) MCG/ACT inhaler Inhale 2 puffs into the lungs every 4 (four) hours as needed for wheezing or shortness of breath.  Marland Kitchen. amLODipine (NORVASC) 10 MG tablet Take 10 mg by mouth Daily.   Marland Kitchen. atorvastatin (LIPITOR) 80 MG tablet Take 1 tablet by mouth daily.  .Marland Kitchen  dextromethorphan-guaiFENesin (MUCINEX DM) 30-600 MG per 12 hr tablet Take 1 tablet by mouth 2 (two) times daily as needed for cough.  . escitalopram (LEXAPRO) 10 MG tablet Take 1 tablet by mouth daily.  . fluticasone (FLONASE) 50 MCG/ACT nasal spray 2 sprays each nare twice a day for 2 more days, then continue 2 sprays each nare daily during pollen season  . Fluticasone-Salmeterol (ADVAIR) 500-50 MCG/DOSE AEPB Inhale 1 puff into the lungs 2 (two) times daily.  Marland Kitchen. HYDROcodone-acetaminophen (NORCO/VICODIN) 5-325 MG per tablet Take 1-2 tablets by mouth every 4 (four) hours as needed for moderate pain (pain and or cough).   Marland Kitchen. levofloxacin (LEVAQUIN) 750 MG tablet Take 750 mg by mouth daily.  Marland Kitchen. loratadine (CLARITIN) 10 MG tablet Take 1 tablet (10 mg total) by mouth daily.  . montelukast (SINGULAIR) 10 MG tablet Take 10 mg by mouth daily.  . naproxen sodium (ANAPROX) 220 MG tablet Take 220 mg by mouth every 12 (twelve) hours as needed (pain).   Marland Kitchen. olmesartan-hydrochlorothiazide (BENICAR HCT) 40-25 MG per tablet Take 1 tablet by mouth daily.  . Omega-3 Fatty Acids (FISH OIL) 1200 MG CAPS Take 1,200 mg by mouth daily.   . predniSONE (DELTASONE) 10 MG tablet Take 4 tabs  daily with food x 4 days, then 3 tabs daily x 4 days, then 2 tabs daily x 4 days, then 1 tab daily x4 days then stop. #40  . sodium chloride (OCEAN) 0.65 % SOLN nasal spray Place 2 sprays into both nostrils 4 (four) times daily.  . Tamsulosin HCl (FLOMAX) 0.4 MG CAPS Take 0.4 mg by mouth daily.    Marland Kitchen. tiotropium (SPIRIVA) 18 MCG inhalation capsule Place 18 mcg into inhaler and inhale daily.  . TRAVATAN Z 0.004 % SOLN ophthalmic solution Place 1 drop into both eyes Daily.   . valACYclovir (VALTREX) 500 MG tablet Take 500 mg by mouth daily as needed (when on steroids).   Marland Kitchen. ZETIA 10 MG tablet Take 1 tablet by mouth daily.  . [DISCONTINUED] levofloxacin (LEVAQUIN) 750 MG tablet Take 1 tablet (750 mg total) by mouth daily.  . mometasone-formoterol (DULERA) 200-5 MCG/ACT AERO Inhale 2 puffs into the lungs 2 (two) times daily.  . pantoprazole (PROTONIX) 20 MG tablet Take 20 mg by mouth daily.  . roflumilast (DALIRESP) 500 MCG TABS tablet Take 500 mcg by mouth daily.  . [DISCONTINUED] Multiple Vitamin (MULTIVITAMIN) tablet Take 1 tablet by mouth daily.  . [DISCONTINUED] simvastatin (ZOCOR) 80 MG tablet Take 1 tablet by mouth daily.

## 2014-02-21 NOTE — Patient Instructions (Signed)
Finish levaquin  Finish prednisone When completed with advair, start dulera Stop daliresp for now Return 2 months Avoid nasal decongestants, use saline in nose only

## 2014-02-22 NOTE — Assessment & Plan Note (Signed)
Gold stage C. COPD with recurrent exacerbations in recent hospitalization now improved Plan Finish levaquin  Finish prednisone When completed with advair, start dulera Stop daliresp for now Return 2 months

## 2014-02-26 NOTE — Discharge Summary (Signed)
Patient seen and examined, agree with above note.  I dictated the care and orders written for this patient under my direction.  Wesam G Yacoub, MD [phone] 

## 2014-03-15 ENCOUNTER — Telehealth: Payer: Self-pay | Admitting: Critical Care Medicine

## 2014-03-15 NOTE — Telephone Encounter (Signed)
I spoke with pt.  He would like daliresp pt assistance samples to be taken to [place].  Advised I will take these to [facility] office Thursday, May 28.  Pt is aware he can pick samples [place] after 9am.  He verbalized understanding, was very appreciative of this, and voiced no further questions or concerns at this time.

## 2014-03-15 NOTE — Telephone Encounter (Signed)
During last OV with PW on 02/21/14 pt was advised to:  Patient Instructions     Finish levaquin  Finish prednisone  When completed with advair, start dulera  Stop daliresp for now  Return 2 months  Avoid nasal decongestants, use saline in nose only   ------  Pt was advised to stop daliresp d/t difficulty obtaining the medication d/t cost.  He had applied for pt assistance but had not yet heard anything.  We received a 90 day shipment of Daliresp 500 mcg through [employer] Pt assistance yesterday specifically for this pt.  I spoke with Dr. Delford Field.  He would like pt to resume the Daliresp now that he has received the med through pt assistance.  I have lmomtcb to inform pt that daliresp is at the front at the [facility] office for pick up.

## 2014-04-11 ENCOUNTER — Encounter: Payer: Self-pay | Admitting: Critical Care Medicine

## 2014-04-11 ENCOUNTER — Ambulatory Visit (INDEPENDENT_AMBULATORY_CARE_PROVIDER_SITE_OTHER): Payer: Medicare Other | Admitting: Critical Care Medicine

## 2014-04-11 VITALS — BP 138/76 | HR 84 | Temp 97.8°F | Ht 66.0 in | Wt 238.0 lb

## 2014-04-11 DIAGNOSIS — J449 Chronic obstructive pulmonary disease, unspecified: Secondary | ICD-10-CM

## 2014-04-11 MED ORDER — FLUTICASONE PROPIONATE 50 MCG/ACT NA SUSP: NASAL | Status: DC

## 2014-04-11 MED ORDER — OMEPRAZOLE 20 MG PO CPDR: 20.0000 mg | DELAYED_RELEASE_CAPSULE | Freq: Every day | ORAL | Status: DC

## 2014-04-11 MED ORDER — AMOXICILLIN-POT CLAVULANATE 875-125 MG PO TABS: 1.0000 | ORAL_TABLET | Freq: Two times a day (BID) | ORAL | Status: DC

## 2014-04-11 MED ORDER — PREDNISONE 10 MG PO TABS: ORAL_TABLET | ORAL | Status: DC

## 2014-04-11 NOTE — Progress Notes (Signed)
Subjective:    Patient ID: [name], male    DOB: September 11, 1946, 68 y.o.   MRN: [id number]  HPI  68 y.o.  male with known hx of COPD     04/11/2014 Chief Complaint  Patient presents with  . 2 month follow up    Has noticed increased wheezing especially qhs and quickly giving out of breath when doing activities - gradually worsening after finishing levaquin and prednisone.  Has prod cough with thick, brown mucus.  No f/c/s.   Pt worsening with QHS wheeze and dyspnea, more cough with thick brown mucus, more DOE.  No real chest pain No fever. No hemoptysis.  Mild heartburn issues. Sinus pressure noted. Review of Systems  Constitutional:   No  weight loss, night sweats,  Fevers, chills, fatigue, or  lassitude.  HEENT:   No headaches,  Difficulty swallowing,  Tooth/dental problems, or  Sore throat,                No sneezing, itching, ear ache,   CV:  No chest pain,  Orthopnea, PND, swelling in lower extremities, anasarca, dizziness, palpitations, syncope.   GI  No heartburn, indigestion, abdominal pain, nausea, vomiting, diarrhea, change in bowel habits, loss of appetite, bloody stools.   Resp:   No coughing up of blood. No chest wall deformity  Skin: no rash or lesions.  GU: no dysuria, change in color of urine, no urgency or frequency.  No flank pain, no hematuria   MS:  No joint pain or swelling.  No decreased range of motion.  No back pain.  Psych:  No change in mood or affect. No depression or anxiety.  No memory loss.      Objective:   Physical Exam BP 138/76  Pulse 84  Temp(Src) 97.8 F (36.6 C) (Oral)  Ht 5\' 6"  (1.676 m)  Wt 238 lb (107.956 kg)  BMI 38.43 kg/m2  SpO2 94%  GEN: A/Ox3; pleasant , NAD, well nourished  Incessant cough  HEENT:  [place]/AT,  EACs-clear, TMs-wnl, NOSE-clear, THROAT-clear, no lesions,  NECK:  Supple w/ fair ROM; no JVD; normal carotid impulses w/o bruits; no thyromegaly or nodules palpated; no lymphadenopathy.  RESP  Exp  wheezes, poor airflow  CARD:  RRR, no m/r/g  , no peripheral edema, pulses intact, no cyanosis or clubbing.  GI:   Soft & nt; nml bowel sounds; no organomegaly or masses detected.  Musco: Warm bil, no deformities or joint swelling noted.   Neuro: alert, no focal deficits noted.    Skin: Warm, no lesions or rashes     Assessment & Plan:   COPD gold C. with recurrent exacerbations Copd with recurrent flare Plan Augmentin twice daily for 10 days Prednisone 10mg  Take 4 for three days 3 for three days 2 for three days 1 for three days and stop Flonase two puff ea nostril twice daily Omeprazole daily All above sent to pharmacy downstairs Stay on spiriva/dulera Return 2 months      Updated Medication List Outpatient Encounter Prescriptions as of 04/11/2014  Medication Sig  . albuterol (PROVENTIL HFA;VENTOLIN HFA) 108 (90 BASE) MCG/ACT inhaler Inhale 2 puffs into the lungs every 4 (four) hours as needed for wheezing or shortness of breath.  Marland Kitchen. amLODipine (NORVASC) 10 MG tablet Take 10 mg by mouth Daily.   Marland Kitchen. atorvastatin (LIPITOR) 80 MG tablet Take 1 tablet by mouth daily.  Marland Kitchen. dextromethorphan-guaiFENesin (MUCINEX DM) 30-600 MG per 12 hr tablet Take 1 tablet by mouth 2 (  two) times daily as needed for cough.  . escitalopram (LEXAPRO) 10 MG tablet Take 1 tablet by mouth daily.  Marland Kitchen. loratadine (CLARITIN) 10 MG tablet Take 1 tablet (10 mg total) by mouth daily.  . mometasone-formoterol (DULERA) 200-5 MCG/ACT AERO Inhale 2 puffs into the lungs 2 (two) times daily.  . montelukast (SINGULAIR) 10 MG tablet Take 10 mg by mouth daily.  . naproxen sodium (ANAPROX) 220 MG tablet Take 220 mg by mouth every 12 (twelve) hours as needed (pain).   Marland Kitchen. olmesartan-hydrochlorothiazide (BENICAR HCT) 40-25 MG per tablet Take 1 tablet by mouth daily.  . roflumilast (DALIRESP) 500 MCG TABS tablet Take 500 mcg by mouth daily.  . Tamsulosin HCl (FLOMAX) 0.4 MG CAPS Take 0.4 mg by mouth daily.    Marland Kitchen. tiotropium  (SPIRIVA) 18 MCG inhalation capsule Place 18 mcg into inhaler and inhale daily.  . TRAVATAN Z 0.004 % SOLN ophthalmic solution Place 1 drop into both eyes Daily.   . valACYclovir (VALTREX) 500 MG tablet Take 500 mg by mouth daily as needed (when on steroids).   Marland Kitchen. ZETIA 10 MG tablet Take 1 tablet by mouth daily.  . [DISCONTINUED] Omega-3 Fatty Acids (FISH OIL) 1200 MG CAPS Take 1,200 mg by mouth daily.   Marland Kitchen. amoxicillin-clavulanate (AUGMENTIN) 875-125 MG per tablet Take 1 tablet by mouth 2 (two) times daily.  . fluticasone (FLONASE) 50 MCG/ACT nasal spray 2 sprays each nare twice a day for 2 more days, then continue 2 sprays each nare daily during pollen season  . omeprazole (PRILOSEC) 20 MG capsule Take 1 capsule (20 mg total) by mouth daily.  . predniSONE (DELTASONE) 10 MG tablet Take 4 for three days 3 for three days 2 for three days 1 for three days and stop  . [DISCONTINUED] fluticasone (FLONASE) 50 MCG/ACT nasal spray 2 sprays each nare twice a day for 2 more days, then continue 2 sprays each nare daily during pollen season  . [DISCONTINUED] Fluticasone-Salmeterol (ADVAIR) 500-50 MCG/DOSE AEPB Inhale 1 puff into the lungs 2 (two) times daily.  . [DISCONTINUED] HYDROcodone-acetaminophen (NORCO/VICODIN) 5-325 MG per tablet Take 1-2 tablets by mouth every 4 (four) hours as needed for moderate pain (pain and or cough).  . [DISCONTINUED] pantoprazole (PROTONIX) 20 MG tablet Take 20 mg by mouth daily.  . [DISCONTINUED] predniSONE (DELTASONE) 10 MG tablet Take 4 tabs  daily with food x 4 days, then 3 tabs daily x 4 days, then 2 tabs daily x 4 days, then 1 tab daily x4 days then stop. #40  . [DISCONTINUED] sodium chloride (OCEAN) 0.65 % SOLN nasal spray Place 2 sprays into both nostrils 4 (four) times daily.

## 2014-04-11 NOTE — Patient Instructions (Signed)
Augmentin twice daily for 10 days Prednisone 10mg  Take 4 for three days 3 for three days 2 for three days 1 for three days and stop Flonase two puff ea nostril twice daily Omeprazole daily All above sent to pharmacy downstairs Stay on spiriva/dulera Return 2 months

## 2014-04-13 NOTE — Assessment & Plan Note (Signed)
Copd with recurrent flare Plan Augmentin twice daily for 10 days Prednisone 10mg  Take 4 for three days 3 for three days 2 for three days 1 for three days and stop Flonase two puff ea nostril twice daily Omeprazole daily All above sent to pharmacy downstairs Stay on spiriva/dulera Return 2 months

## 2014-04-19 ENCOUNTER — Ambulatory Visit: Payer: Medicare Other | Admitting: Dietician

## 2014-04-29 ENCOUNTER — Telehealth: Payer: Self-pay | Admitting: Critical Care Medicine

## 2014-04-29 NOTE — Telephone Encounter (Signed)
Called spoke with [employer]. She reports pt [place]. Pt was sent out 90 day supply of spiriva 01/25/14. They received pt appeal but have not been able to get in touch with pt regarding his application.  They still have not received application, proof of income, RX back yet. When they tried reaching pt his VM is not set up.  I called spoke with pt. He reports he has sent in this information twice. He told them this already. He is going to call them again and get this straight. Pt is aware if he needs us to send this in to bring us the appropriate information and we will do so.  Nothing further needed

## 2014-04-29 NOTE — Telephone Encounter (Signed)
Called # provided and was on hold x 10 min. WCB

## 2014-05-09 ENCOUNTER — Telehealth: Payer: Self-pay | Admitting: Critical Care Medicine

## 2014-05-09 MED ORDER — TIOTROPIUM BROMIDE MONOHYDRATE 18 MCG IN CAPS: 18.0000 ug | ORAL_CAPSULE | Freq: Every day | RESPIRATORY_TRACT | Status: DC

## 2014-05-09 NOTE — Telephone Encounter (Signed)
Pt aware samples left for pick up. Nothing further needed 

## 2014-05-29 ENCOUNTER — Telehealth: Payer: Self-pay | Admitting: Critical Care Medicine

## 2014-05-29 MED ORDER — TIOTROPIUM BROMIDE MONOHYDRATE 18 MCG IN CAPS: 18.0000 ug | ORAL_CAPSULE | Freq: Every day | RESPIRATORY_TRACT | Status: DC

## 2014-05-29 NOTE — Telephone Encounter (Signed)
Called spoke with pt. He reports spiriva is no longer covered under his insurance. He does not know what is covered. Please advise Dr. Delford FieldWright thanks  No Known Allergies

## 2014-05-29 NOTE — Telephone Encounter (Signed)
Called spoke w/ pt. Aware samples will be left for pick up in [place] tomorrow Afternoon.  Nothing further needed

## 2014-05-29 NOTE — Telephone Encounter (Signed)
Called med center in [place] to see if it tells them any alternative. Spoke with [employer]. He reports he ran claim for spiriva and it is a $45 co-pay.  Called spoke with pt. Made him aware of what the pharmacy advised me of his co-pay. He reports he will just pay the co-pay then. Nothing further needed

## 2014-05-29 NOTE — Telephone Encounter (Signed)
pls have someone figure out what this pt has on his formulary and get back to me

## 2014-06-20 ENCOUNTER — Ambulatory Visit (INDEPENDENT_AMBULATORY_CARE_PROVIDER_SITE_OTHER): Payer: Medicare Other | Admitting: Critical Care Medicine

## 2014-06-20 ENCOUNTER — Encounter: Payer: Self-pay | Admitting: Critical Care Medicine

## 2014-06-20 VITALS — BP 118/74 | HR 81 | Temp 98.2°F | Ht 67.5 in | Wt 223.0 lb

## 2014-06-20 DIAGNOSIS — J449 Chronic obstructive pulmonary disease, unspecified: Secondary | ICD-10-CM

## 2014-06-20 DIAGNOSIS — Z23 Encounter for immunization: Secondary | ICD-10-CM

## 2014-06-20 MED ORDER — TIOTROPIUM BROMIDE MONOHYDRATE 18 MCG IN CAPS: ORAL_CAPSULE | RESPIRATORY_TRACT | Status: DC

## 2014-06-20 NOTE — Patient Instructions (Signed)
Prevnar 13 vaccine Get flu vaccine. Stop spiriva Stay on dulera Return 3 months

## 2014-06-20 NOTE — Progress Notes (Signed)
Subjective:    Patient ID: [name], male    DOB: Mar 25, 1946, 68 y.o.   MRN: [id number]  HPI  68 y.o.  male with known hx of COPD   06/20/2014 Chief Complaint  Patient presents with  . 2 month follow up    Breathing has improved.  SOB is at baseline.  Has a cough with thick, brown mucus.  No wheezing, chest tightness/pain, or f/c/s.  Dyspnea and wheezing is better.  Pt otes min cough, does produce some mucus brown in early AM.  No nasal congestion.  Dsypnea at baseline. Pt denies any significant sore throat, nasal congestion or excess secretions, fever, chills, sweats, unintended weight loss, pleurtic or exertional chest pain, orthopnea PND, or leg swelling Pt denies any increase in rescue therapy over baseline, denies waking up needing it or having any early am or nocturnal exacerbations of coughing/wheezing/or dyspnea. Pt also denies any obvious fluctuation in symptoms with  weather or environmental change or other alleviating or aggravating factors    Review of Systems  Constitutional:   No  weight loss, night sweats,  Fevers, chills, fatigue, or  lassitude.  HEENT:   No headaches,  Difficulty swallowing,  Tooth/dental problems, or  Sore throat,                No sneezing, itching, ear ache,   CV:  No chest pain,  Orthopnea, PND, swelling in lower extremities, anasarca, dizziness, palpitations, syncope.   GI  No heartburn, indigestion, abdominal pain, nausea, vomiting, diarrhea, change in bowel habits, loss of appetite, bloody stools.   Resp:   No coughing up of blood. No chest wall deformity  Skin: no rash or lesions.  GU: no dysuria, change in color of urine, no urgency or frequency.  No flank pain, no hematuria   MS:  No joint pain or swelling.  No decreased range of motion.  No back pain.  Psych:  No change in mood or affect. No depression or anxiety.  No memory loss.      Objective:   Physical Exam BP 118/74  Pulse 81  Temp(Src) 98.2 F (36.8 C)  (Oral)  Ht 5' 7.5" (1.715 m)  Wt 223 lb (101.152 kg)  BMI 34.39 kg/m2  SpO2 96%  GEN: A/Ox3; pleasant , NAD, well nourished  Incessant cough  HEENT:  [place]/AT,  EACs-clear, TMs-wnl, NOSE-clear, THROAT-clear, no lesions,  NECK:  Supple w/ fair ROM; no JVD; normal carotid impulses w/o bruits; no thyromegaly or nodules palpated; no lymphadenopathy.  RESP  Improved airflow  CARD:  RRR, no m/r/g  , no peripheral edema, pulses intact, no cyanosis or clubbing.  GI:   Soft & nt; nml bowel sounds; no organomegaly or masses detected.  Musco: Warm bil, no deformities or joint swelling noted.   Neuro: alert, no focal deficits noted.    Skin: Warm, no lesions or rashes     Assessment & Plan:   COPD gold C. with recurrent exacerbations Chronic obstructive lung disease gold stage C. with recurrent exacerbations stable at this time Plan Prevnar 13 vaccine Get flu vaccine. Stop spiriva Stay on dulera Return 3 months     Updated Medication List Outpatient Encounter Prescriptions as of 06/20/2014  Medication Sig  . albuterol (PROVENTIL HFA;VENTOLIN HFA) 108 (90 BASE) MCG/ACT inhaler Inhale 2 puffs into the lungs every 4 (four) hours as needed for wheezing or shortness of breath.  Marland Kitchen amLODipine (NORVASC) 10 MG tablet Take 10 mg by mouth Daily.   Marland Kitchen  atorvastatin (LIPITOR) 80 MG tablet Take 1 tablet by mouth daily.  Marland Kitchen dextromethorphan-guaiFENesin (MUCINEX DM) 30-600 MG per 12 hr tablet Take 1 tablet by mouth 2 (two) times daily as needed for cough.  . escitalopram (LEXAPRO) 10 MG tablet Take 1 tablet by mouth daily.  . fluticasone (FLONASE) 50 MCG/ACT nasal spray Place 2 sprays into both nostrils daily.  Marland Kitchen loratadine (CLARITIN) 10 MG tablet Take 1 tablet (10 mg total) by mouth daily.  . mometasone-formoterol (DULERA) 200-5 MCG/ACT AERO Inhale 2 puffs into the lungs 2 (two) times daily.  . montelukast (SINGULAIR) 10 MG tablet Take 10 mg by mouth daily.  . naproxen sodium (ANAPROX) 220 MG  tablet Take 220 mg by mouth every 12 (twelve) hours as needed (pain).   Marland Kitchen olmesartan-hydrochlorothiazide (BENICAR HCT) 40-25 MG per tablet Take 1 tablet by mouth daily.  Marland Kitchen omeprazole (PRILOSEC) 20 MG capsule Take 1 capsule (20 mg total) by mouth daily.  . roflumilast (DALIRESP) 500 MCG TABS tablet Take 500 mcg by mouth daily.  . Tamsulosin HCl (FLOMAX) 0.4 MG CAPS Take 0.4 mg by mouth daily.    . TRAVATAN Z 0.004 % SOLN ophthalmic solution Place 1 drop into both eyes Daily.   . valACYclovir (VALTREX) 500 MG tablet Take 500 mg by mouth daily as needed (when on steroids).   Marland Kitchen ZETIA 10 MG tablet Take 1 tablet by mouth daily.  . [DISCONTINUED] fluticasone (FLONASE) 50 MCG/ACT nasal spray 2 sprays each nare twice a day for 2 more days, then continue 2 sprays each nare daily during pollen season  . tiotropium (SPIRIVA) 18 MCG inhalation capsule HOLD for now. Can resume if breathing worsens and call us  . [DISCONTINUED] amoxicillin-clavulanate (AUGMENTIN) 875-125 MG per tablet Take 1 tablet by mouth 2 (two) times daily.  . [DISCONTINUED] predniSONE (DELTASONE) 10 MG tablet Take 4 for three days 3 for three days 2 for three days 1 for three days and stop  . [DISCONTINUED] tiotropium (SPIRIVA) 18 MCG inhalation capsule Place 1 capsule (18 mcg total) into inhaler and inhale daily.

## 2014-06-21 NOTE — Assessment & Plan Note (Signed)
Chronic obstructive lung disease gold stage C. with recurrent exacerbations stable at this time Plan Prevnar 13 vaccine Get flu vaccine. Stop spiriva Stay on dulera Return 3 months

## 2014-06-22 ENCOUNTER — Encounter: Payer: Self-pay | Admitting: Internal Medicine

## 2014-08-01 ENCOUNTER — Other Ambulatory Visit: Payer: Self-pay | Admitting: Critical Care Medicine

## 2014-09-10 ENCOUNTER — Other Ambulatory Visit: Payer: Self-pay | Admitting: Critical Care Medicine

## 2014-09-24 ENCOUNTER — Telehealth: Payer: Self-pay | Admitting: Critical Care Medicine

## 2014-09-24 MED ORDER — ROFLUMILAST 500 MCG PO TABS: 500.0000 ug | ORAL_TABLET | Freq: Every day | ORAL | Status: DC

## 2014-09-24 NOTE — Telephone Encounter (Signed)
Received application from [facility] for Dalirespt 500 mcg.   Requesting form be signed by MD and mailed back to below address along with original daliresp rx:  [facility] [address] [place], [place] [id number] Signed Application and rx placed in mailed and copy of application placed in scan folder.  lmomtcb for pt to advise of above.

## 2014-09-25 NOTE — Telephone Encounter (Signed)
Spoke with pt - Advised of below.  He verbalized understanding and voiced no further questions or concerns at this time.

## 2014-11-14 ENCOUNTER — Ambulatory Visit: Payer: Medicare Other | Admitting: Critical Care Medicine

## 2014-11-26 ENCOUNTER — Telehealth: Payer: Self-pay | Admitting: Critical Care Medicine

## 2014-11-26 MED ORDER — MOMETASONE FURO-FORMOTEROL FUM 200-5 MCG/ACT IN AERO: 2.0000 | INHALATION_SPRAY | Freq: Two times a day (BID) | RESPIRATORY_TRACT | Status: AC

## 2014-11-26 NOTE — Telephone Encounter (Signed)
[facility] fax from [facility] along with form with medication information signed by Dr. Delford FieldWright and mailed to  [facility] [address]  [place] [id number]. Signed Dulera form, Pt Enrollment form, and signed dulera rx placed in mail to above address.  lmomtcb for pt to advise of above.

## 2014-11-26 NOTE — Telephone Encounter (Signed)
Called and spoke with pt, aware of status of patient assistance forms. Aware these have been mailed to address below.  Will send to [name] as FYI.

## 2014-12-12 ENCOUNTER — Encounter: Payer: Self-pay | Admitting: Critical Care Medicine

## 2014-12-12 ENCOUNTER — Telehealth: Payer: Self-pay | Admitting: Critical Care Medicine

## 2014-12-12 ENCOUNTER — Ambulatory Visit (INDEPENDENT_AMBULATORY_CARE_PROVIDER_SITE_OTHER): Payer: Medicare Other | Admitting: Critical Care Medicine

## 2014-12-12 VITALS — BP 130/84 | HR 73 | Temp 99.3°F | Ht 66.0 in | Wt 222.0 lb

## 2014-12-12 DIAGNOSIS — J441 Chronic obstructive pulmonary disease with (acute) exacerbation: Secondary | ICD-10-CM

## 2014-12-12 DIAGNOSIS — J440 Chronic obstructive pulmonary disease with acute lower respiratory infection: Principal | ICD-10-CM

## 2014-12-12 DIAGNOSIS — J209 Acute bronchitis, unspecified: Secondary | ICD-10-CM

## 2014-12-12 MED ORDER — ATORVASTATIN CALCIUM 80 MG PO TABS: ORAL_TABLET | ORAL | Status: AC

## 2014-12-12 MED ORDER — PREDNISONE 10 MG PO TABS: ORAL_TABLET | ORAL | Status: DC

## 2014-12-12 MED ORDER — CLARITHROMYCIN 500 MG PO TABS: 500.0000 mg | ORAL_TABLET | Freq: Two times a day (BID) | ORAL | Status: DC

## 2014-12-12 NOTE — Telephone Encounter (Signed)
Patient aware will mail reflux diet to him. Nothing further needed

## 2014-12-12 NOTE — Progress Notes (Signed)
Subjective:    Patient ID: [name], male    DOB: [birth year], 69 y.o.   MRN: [id number]  HPI 69 y.o.  male with known hx of COPD   12/12/2014 Chief Complaint  Patient presents with  . 6 month follow up    Breathing has worsened with weather changes.  Cough with brown mucus - worse qhs, wheezing, and increased DOE.  Noticed small amount ot bright red mucus x 1 beginning of this wk.  No chest tightness/CP or f/c/s.  Pt worse with cold weather.  Pt notes dyspnea worse at night, prod brown mucus. Notes more wheezing QHS.  Neb helps but is up all night.  No fever. One time some blood. Notes some pn drip and rhinorhea. No real chest pain   Notes some DOE but ok at rest.  No edema in the feet.   Pt denies any significant sore throat, nasal congestion or excess secretions, fever, chills, sweats, unintended weight loss, pleurtic or exertional chest pain, orthopnea PND, or leg swelling Pt denies any increase in rescue therapy over baseline, denies waking up needing it or having any early am or nocturnal exacerbations of coughing/wheezing/or dyspnea. Pt also denies any obvious fluctuation in symptoms with  weather or environmental change or other alleviating or aggravating factors  Pt now retired .     Review of Systems Constitutional:   No  weight loss, night sweats,  Fevers, chills, fatigue, or  lassitude.  HEENT:   No headaches,  Difficulty swallowing,  Tooth/dental problems, or  Sore throat,                No sneezing, itching, ear ache,   CV:  No chest pain,  Orthopnea, PND, swelling in lower extremities, anasarca, dizziness, palpitations, syncope.   GI  No heartburn, indigestion, abdominal pain, nausea, vomiting, diarrhea, change in bowel habits, loss of appetite, bloody stools.   Resp:   No coughing up of blood. No chest wall deformity  Skin: no rash or lesions.  GU: no dysuria, change in color of urine, no urgency or frequency.  No flank pain, no hematuria   MS:  No  joint pain or swelling.  No decreased range of motion.  No back pain.  Psych:  No change in mood or affect. No depression or anxiety.  No memory loss.      Objective:   Physical ExamBP 130/84 mmHg  Pulse 73  Temp(Src) 99.3 F (37.4 C) (Oral)  Ht  (1.676 m)  Wt 222 lb (100.699 kg)  BMI 35.85 kg/m2  SpO2 95%  GEN: A/Ox3; pleasant , NAD, well nourished  Incessant cough  HEENT:  [place]/AT,  EACs-clear, TMs-wnl, NOSE-clear, THROAT-clear, no lesions,  NECK:  Supple w/ fair ROM; no JVD; normal carotid impulses w/o bruits; no thyromegaly or nodules palpated; no lymphadenopathy.  RESP  Exp wheezes poor airflow  CARD:  RRR, no m/r/g  , no peripheral edema, pulses intact, no cyanosis or clubbing.  GI:   Soft & nt; nml bowel sounds; no organomegaly or masses detected.  Musco: Warm bil, no deformities or joint swelling noted.   Neuro: alert, no focal deficits noted.    Skin: Warm, no lesions or rashes     Assessment & Plan:   COPD with acute bronchitis Gold C recurrent exacerbations Copd with acute flare  Plan Prednisone  Take 4 for three days 3 for three days 2 for three days 1 for three days and stop Biaxin(clarithromycin)  twice daily  for 7days HOLD ATORVASTIN while on  Biaxin, then REsume when biaxin is finished No other changes Return 2 months      Updated Medication List Outpatient Encounter Prescriptions as of 12/12/2014  Medication Sig  . albuterol (PROVENTIL HFA;VENTOLIN HFA) 108 (90 BASE) MCG/ACT inhaler Inhale 2 puffs into the lungs every 4 (four) hours as needed for wheezing or shortness of breath.  Marland Kitchen. amLODipine (NORVASC) 10 MG tablet Take 10 mg by mouth Daily.   Marland Kitchen. atorvastatin (LIPITOR) 80 MG tablet HOLD while on clarithromycin, resume when Antibiotic finished  . dextromethorphan-guaiFENesin (MUCINEX DM) 30-600 MG per 12 hr tablet Take 1 tablet by mouth 2 (two) times daily as needed for cough.  . escitalopram (LEXAPRO) 10 MG tablet Take 1 tablet by  mouth daily.  . mometasone-formoterol (DULERA) 200-5 MCG/ACT AERO Inhale 2 puffs into the lungs 2 (two) times daily.  . montelukast (SINGULAIR) 10 MG tablet TAKE 1 TABLET BY MOUTH DAILY  . naproxen sodium (ANAPROX) 220 MG tablet Take 220 mg by mouth every 12 (twelve) hours as needed (pain).   Marland Kitchen. olmesartan-hydrochlorothiazide (BENICAR HCT) 40-25 MG per tablet Take 1 tablet by mouth daily.  Marland Kitchen. omeprazole (PRILOSEC) 20 MG capsule TAKE 1 CAPSULE (20 MG) BY MOUTH DAILY.  Marland Kitchen. oxymetazoline (AFRIN) 0.05 % nasal spray Place 1 spray into both nostrils as needed for congestion.  . roflumilast (DALIRESP) 500 MCG TABS tablet Take 1 tablet (500 mcg total) by mouth daily.  . Tamsulosin HCl (FLOMAX) 0.4 MG CAPS Take 0.4 mg by mouth daily.    . TRAVATAN Z 0.004 % SOLN ophthalmic solution Place 1 drop into both eyes Daily.   . valACYclovir (VALTREX) 500 MG tablet Take 500 mg by mouth daily as needed (when on steroids).   Marland Kitchen. ZETIA 10 MG tablet Take 1 tablet by mouth daily.  . [DISCONTINUED] atorvastatin (LIPITOR) 80 MG tablet Take 1 tablet by mouth daily.  . [DISCONTINUED] montelukast (SINGULAIR) 10 MG tablet Take 10 mg by mouth daily.  . clarithromycin (BIAXIN) 500 MG tablet Take 1 tablet (500 mg total) by mouth 2 (two) times daily.  . fluticasone (FLONASE) 50 MCG/ACT nasal spray Place 2 sprays into both nostrils daily.  Marland Kitchen. loratadine (CLARITIN) 10 MG tablet Take 1 tablet (10 mg total) by mouth daily. (Patient not taking: Reported on 12/12/2014)  . predniSONE (DELTASONE) 10 MG tablet Take 4 for three days 3 for three days 2 for three days 1 for three days and stop  . [DISCONTINUED] tiotropium (SPIRIVA) 18 MCG inhalation capsule HOLD for now. Can resume if breathing worsens and call us (Patient not taking: Reported on 12/12/2014)

## 2014-12-12 NOTE — Patient Instructions (Signed)
Prednisone 10mg  Take 4 for three days 3 for three days 2 for three days 1 for three days and stop Biaxin(clarithromycin) 500mg  twice daily for 7days HOLD ATORVASTIN while on  Biaxin, then REsume when biaxin is finished No other changes Return 2 months

## 2014-12-13 ENCOUNTER — Telehealth: Payer: Self-pay | Admitting: Critical Care Medicine

## 2014-12-13 NOTE — Assessment & Plan Note (Signed)
Copd with acute flare  Plan Prednisone 10mg  Take 4 for three days 3 for three days 2 for three days 1 for three days and stop Biaxin(clarithromycin) 500mg  twice daily for 7days HOLD ATORVASTIN while on  Biaxin, then REsume when biaxin is finished No other changes Return 2 months

## 2014-12-13 NOTE — Telephone Encounter (Signed)
Spoke with pt. States that he does not need a refill on Dali[facility]. He was getting his medication through Patient Assistance. They are no longer going to be supplying him with this due to his insurance. Pt knows that he will not be able to afford this medication without this help. Wants to know if he can stop this medication or is there something that can replace the Daliresp.  PW - please advise. Thanks.

## 2014-12-13 NOTE — Telephone Encounter (Signed)
Pt aware of pw's recs.  Nothing further needed at this time.

## 2014-12-13 NOTE — Telephone Encounter (Signed)
Stop med and see how he does

## 2014-12-25 ENCOUNTER — Telehealth: Payer: Self-pay | Admitting: Critical Care Medicine

## 2014-12-25 MED ORDER — PREDNISONE 10 MG PO TABS: ORAL_TABLET | ORAL | Status: DC

## 2014-12-25 MED ORDER — LEVOFLOXACIN 500 MG PO TABS: 500.0000 mg | ORAL_TABLET | Freq: Every day | ORAL | Status: DC

## 2014-12-25 NOTE — Telephone Encounter (Signed)
Spoke with pt. States that his cough has returned. Mucus is green in color. SOB and chest tightness are also present. Would like something called in.  PW - please advise. Thanks.

## 2014-12-25 NOTE — Telephone Encounter (Signed)
Call in levaquin 500mg  daily x 5days Call in prednisone 10mg  Take 4 for two days three for two days two for two days one for two days #20

## 2014-12-25 NOTE — Telephone Encounter (Signed)
LMTC x 1  

## 2014-12-25 NOTE — Telephone Encounter (Signed)
Pt cb please call at previous number listed ° °

## 2014-12-25 NOTE — Telephone Encounter (Signed)
Rx's have been sent in per PW. Pt is aware. Nothing further was needed.

## 2015-02-04 ENCOUNTER — Ambulatory Visit (INDEPENDENT_AMBULATORY_CARE_PROVIDER_SITE_OTHER): Payer: Medicare Other | Admitting: Pulmonary Disease

## 2015-02-04 ENCOUNTER — Encounter: Payer: Self-pay | Admitting: Pulmonary Disease

## 2015-02-04 ENCOUNTER — Ambulatory Visit (INDEPENDENT_AMBULATORY_CARE_PROVIDER_SITE_OTHER)
Admission: RE | Admit: 2015-02-04 | Discharge: 2015-02-04 | Disposition: A | Discharge status: Home / Self Care | Payer: Medicare Other | Source: Ambulatory Visit | Attending: Pulmonary Disease | Admitting: Pulmonary Disease

## 2015-02-04 ENCOUNTER — Ambulatory Visit: Payer: Medicare Other | Admitting: Pulmonary Disease

## 2015-02-04 VITALS — BP 132/66 | HR 100 | Temp 97.0°F | Ht 67.0 in | Wt 224.2 lb

## 2015-02-04 DIAGNOSIS — J441 Chronic obstructive pulmonary disease with (acute) exacerbation: Secondary | ICD-10-CM

## 2015-02-04 DIAGNOSIS — J209 Acute bronchitis, unspecified: Secondary | ICD-10-CM

## 2015-02-04 DIAGNOSIS — J440 Chronic obstructive pulmonary disease with acute lower respiratory infection: Principal | ICD-10-CM

## 2015-02-04 MED ORDER — METHYLPREDNISOLONE ACETATE 80 MG/ML IJ SUSP
120.0000 mg | Freq: Once | INTRAMUSCULAR | Status: AC
  Administered 2015-02-04: 120 mg via INTRAMUSCULAR

## 2015-02-04 MED ORDER — ALBUTEROL SULFATE (2.5 MG/3ML) 0.083% IN NEBU
2.5000 mg | INHALATION_SOLUTION | Freq: Once | RESPIRATORY_TRACT | Status: AC
  Administered 2015-02-04: 2.5 mg via RESPIRATORY_TRACT

## 2015-02-04 MED ORDER — LEVOFLOXACIN 750 MG PO TABS: 750.0000 mg | ORAL_TABLET | Freq: Every day | ORAL | Status: DC

## 2015-02-04 MED ORDER — PREDNISONE 10 MG PO TABS: ORAL_TABLET | ORAL | Status: DC

## 2015-02-04 NOTE — Patient Instructions (Signed)
Will treat with a neb treatment, and depomedrol injection. Prednisone taper over 8 days Levaquin 750mg  one a day for 7 days Will check a chest xray today, and call you with results. followup with nurse practitioner Monday or Tues of next week, but let us know if worsening.  If after hours, need to go to ER.

## 2015-02-04 NOTE — Assessment & Plan Note (Signed)
The patient is having an acute COPD exacerbation, probably brought on by acute bronchitis. He is obviously going to need treatment with an antibiotic as well as steroids, but if he does not improve, he is going to need hospital admission. His oxygen saturations are adequate at this time, and he has minimal increased work of breathing. He would like to try and take care of this as an outpatient rather than hospitalization.

## 2015-02-04 NOTE — Progress Notes (Signed)
   Subjective:    Patient ID: [name], male    DOB: [birth year], 69 y.o.   MRN: [id number]  HPI The patient comes in today for an acute sick visit. He is normally followed by Dr. Delford FieldWright for significant COPD with a history of recurrent exacerbations. He tells me that he has had increased symptoms for over a month, but have culminated in increased shortness of breath, wheezing, chest tightness, cough with purulent mucus. He has not had any fevers, chills, or sweats. His oxygen levels are adequate in the office today, and he has minimal increased work of breathing.   Review of Systems  Constitutional: Negative for fever and unexpected weight change.  HENT: Positive for congestion and postnasal drip. Negative for dental problem, ear pain, nosebleeds, rhinorrhea, sinus pressure, sneezing, sore throat and trouble swallowing.   Eyes: Negative for redness and itching.  Respiratory: Positive for cough, chest tightness, shortness of breath and wheezing.   Cardiovascular: Negative for palpitations and leg swelling.  Gastrointestinal: Negative for nausea and vomiting.  Genitourinary: Negative for dysuria.  Musculoskeletal: Negative for joint swelling.  Skin: Negative for rash.  Neurological: Negative for headaches.  Hematological: Does not bruise/bleed easily.  Psychiatric/Behavioral: Negative for dysphoric mood. The patient is not nervous/anxious.        Objective:   Physical Exam Obese male in no acute distress Nose without purulence or discharge noted Neck without lymphadenopathy or thyromegaly Chest with diffuse expiratory wheezes, but adequate airflow. There are no crackles on exam. There are prominent upper airway pseudo wheezes. Cardiac exam with regular rate and rhythm Lower extremities with minimal edema, no cyanosis Alert and oriented, moves all 4 extremities.       Assessment & Plan:

## 2015-02-05 ENCOUNTER — Other Ambulatory Visit: Payer: Self-pay | Admitting: Critical Care Medicine

## 2015-02-06 ENCOUNTER — Telehealth: Payer: Self-pay | Admitting: Critical Care Medicine

## 2015-02-06 NOTE — Telephone Encounter (Signed)
Noted,  Keep appts

## 2015-02-06 NOTE — Telephone Encounter (Signed)
Spoke with pt, states he had a coughing spell at dinner last night, coughed, had a 15 second seizure.  911 was called, his vitals were checked out and everything was seemingly well.  Pt has appt with PCP on Monday and rov with his PCP and sees TP on Tuesday.   Forwarding to PW as FYI.  Pt is not requesting any recs at this time, but is wanting to make his providers aware.

## 2015-02-11 ENCOUNTER — Ambulatory Visit: Payer: Medicare Other | Admitting: Adult Health

## 2015-02-20 ENCOUNTER — Encounter: Payer: Self-pay | Admitting: Critical Care Medicine

## 2015-02-20 ENCOUNTER — Ambulatory Visit (INDEPENDENT_AMBULATORY_CARE_PROVIDER_SITE_OTHER): Payer: Medicare Other | Admitting: Critical Care Medicine

## 2015-02-20 VITALS — BP 128/86 | HR 72 | Temp 98.8°F | Ht 66.0 in | Wt 227.0 lb

## 2015-02-20 DIAGNOSIS — J209 Acute bronchitis, unspecified: Secondary | ICD-10-CM

## 2015-02-20 DIAGNOSIS — J441 Chronic obstructive pulmonary disease with (acute) exacerbation: Secondary | ICD-10-CM

## 2015-02-20 DIAGNOSIS — J440 Chronic obstructive pulmonary disease with acute lower respiratory infection: Principal | ICD-10-CM

## 2015-02-20 MED ORDER — PREDNISONE 10 MG PO TABS
ORAL_TABLET | ORAL | Status: DC
Start: 2015-02-20 — End: 2015-07-17

## 2015-02-20 MED ORDER — LEVOFLOXACIN 750 MG PO TABS: 750.0000 mg | ORAL_TABLET | Freq: Every day | ORAL | Status: DC

## 2015-02-20 NOTE — Assessment & Plan Note (Signed)
Gold stage C with recurrent exacerbations Plan Prednisone 10mg  Take 4 for two days three for two days two for two days one for two days Levaquin 500mg  daily for 5 days Use spacer with dulera and ventolin Return 2 months

## 2015-02-20 NOTE — Patient Instructions (Signed)
Prednisone 10mg  Take 4 for two days three for two days two for two days one for two days Levaquin 500mg  daily for 5 days Use spacer with dulera and ventolin Return 2 months

## 2015-02-20 NOTE — Progress Notes (Signed)
Subjective:    Patient ID: [name], male    DOB: [birth year], 69 y.o.   MRN: [id number]  HPI The patient comes in today for an acute sick visit. He is normally followed by Dr. Delford Field for significant COPD with a history of recurrent exacerbations. He tells me that he has had increased symptoms for over a month, but have culminated in increased shortness of breath, wheezing, chest tightness, cough with purulent mucus. He has not had any fevers, chills, or sweats. His oxygen levels are adequate in the office today, and he has minimal increased work of breathing.  02/20/2015 Chief Complaint  Patient presents with  . 2 wk follow up    SOB has improved but still has occas green mucus with cough and slight wheezing qhs.  No chest tightness/CP or f/c/s.  Pt just seen two weeks ago for flare.  Pt notes frequent mucus, even after prednisone.  Mucus never fully clears.  rx levoquin x 7days and pred pulse.  CXR was clear, no PNA seen.   No real edema in feet.  Notes some sinus issues, esp at night.  Notes some pndrip. Pt denies any significant sore throat, nasal congestion or excess secretions, fever, chills, sweats, unintended weight loss, pleurtic or exertional chest pain, orthopnea PND, or leg swelling Pt denies any increase in rescue therapy over baseline, denies waking up needing it or having any early am or nocturnal exacerbations of coughing/wheezing/or dyspnea. Pt also denies any obvious fluctuation in symptoms with  weather or environmental change or other alleviating or aggravating factors    Review of Systems  Constitutional: Negative for fever and unexpected weight change.  HENT: Positive for congestion and postnasal drip. Negative for dental problem, ear pain, nosebleeds, rhinorrhea, sinus pressure, sneezing, sore throat and trouble swallowing.   Eyes: Negative for redness and itching.  Respiratory: Positive for cough and shortness of breath. Negative for chest tightness and  wheezing.        Prod cough green mucus.   Cardiovascular: Negative for palpitations and leg swelling.  Gastrointestinal: Negative for nausea and vomiting.  Genitourinary: Negative for dysuria.  Musculoskeletal: Negative for joint swelling.  Skin: Negative for rash.  Neurological: Negative for headaches.  Hematological: Does not bruise/bleed easily.  Psychiatric/Behavioral: Negative for dysphoric mood. The patient is not nervous/anxious.        Objective:   Physical Exam Filed Vitals:   02/20/15 1018  BP: 128/86  Pulse: 72  Temp: 98.8 F (37.1 C)  TempSrc: Oral  Height:  (1.676 m)  Weight: 227 lb (102.967 kg)  SpO2: 97%    Gen: Pleasant, well-nourished, in no distress,  normal affect  ENT: No lesions,  mouth clear,  oropharynx clear, no postnasal drip  Neck: No JVD, no TMG, no carotid bruits  Lungs: No use of accessory muscles, no dullness to percussion, inspiratory and expiratory wheezes with poor airflow Cardiovascular: RRR, heart sounds normal, no murmur or gallops, no peripheral edema  Abdomen: soft and NT, no HSM,  BS normal  Musculoskeletal: No deformities, no cyanosis or clubbing  Neuro: alert, non focal  Skin: Warm, no lesions or rashes  No results found.      Assessment & Plan:   COPD with acute bronchitis Gold C recurrent exacerbations Gold stage C with recurrent exacerbations Plan Prednisone  Take 4 for two days three for two days two for two days one for two days Levaquin  daily for 5 days Use spacer with dulera and ventolin  Return 2 months     Updated Medication List Outpatient Encounter Prescriptions as of 02/20/2015  Medication Sig  . albuterol (PROVENTIL HFA;VENTOLIN HFA) 108 (90 BASE) MCG/ACT inhaler Inhale 2 puffs into the lungs every 4 (four) hours as needed for wheezing or shortness of breath.  Marland Kitchen. amLODipine (NORVASC) 10 MG tablet Take 10 mg by mouth Daily.   Marland Kitchen. atorvastatin (LIPITOR) 80 MG tablet HOLD while on  clarithromycin, resume when Antibiotic finished (Patient taking differently: Take 80 mg by mouth daily. )  . escitalopram (LEXAPRO) 10 MG tablet Take 1 tablet by mouth daily.  . hydrochlorothiazide (HYDRODIURIL) 25 MG tablet Take 25 mg by mouth daily.  Marland Kitchen. loratadine (CLARITIN) 10 MG tablet Take 1 tablet (10 mg total) by mouth daily.  . mometasone-formoterol (DULERA) 200-5 MCG/ACT AERO Inhale 2 puffs into the lungs 2 (two) times daily.  . montelukast (SINGULAIR) 10 MG tablet TAKE 1 TABLET BY MOUTH DAILY  . omeprazole (PRILOSEC) 20 MG capsule TAKE 1 CAPSULE (20 MG) BY MOUTH DAILY.  . Tamsulosin HCl (FLOMAX) 0.4 MG CAPS Take 0.4 mg by mouth daily.    . TRAVATAN Z 0.004 % SOLN ophthalmic solution Place 1 drop into both eyes daily as needed.   . [DISCONTINUED] oxymetazoline (AFRIN) 0.05 % nasal spray Place 1 spray into both nostrils as needed for congestion.  Marland Kitchen. levofloxacin (LEVAQUIN) 750 MG tablet Take 1 tablet (750 mg total) by mouth daily.  . predniSONE (DELTASONE) 10 MG tablet Take 4 for two days three for two days two for two days one for two days  . [DISCONTINUED] levofloxacin (LEVAQUIN) 750 MG tablet Take 1 tablet (750 mg total) by mouth daily. (Patient not taking: Reported on 02/20/2015)  . [DISCONTINUED] naproxen sodium (ANAPROX) 220 MG tablet Take 220 mg by mouth every 12 (twelve) hours as needed (pain).   . [DISCONTINUED] predniSONE (DELTASONE) 10 MG tablet Take 4 tabs daily x 2 days, 3 tabs daily x 2 days, 2 tabs daily x 2 days, 1 tab daily x 2 days (Patient not taking: Reported on 02/20/2015)  . [DISCONTINUED] valACYclovir (VALTREX) 500 MG tablet Take 500 mg by mouth daily as needed (when on steroids).

## 2015-03-19 ENCOUNTER — Other Ambulatory Visit: Payer: Self-pay | Admitting: Critical Care Medicine

## 2015-04-17 ENCOUNTER — Ambulatory Visit: Payer: Medicare Other | Admitting: Adult Health

## 2015-04-21 ENCOUNTER — Other Ambulatory Visit: Payer: Self-pay

## 2015-05-15 ENCOUNTER — Ambulatory Visit: Payer: Medicare Other | Admitting: Adult Health

## 2015-06-12 ENCOUNTER — Ambulatory Visit: Payer: Medicare Other | Admitting: Adult Health

## 2015-06-19 ENCOUNTER — Ambulatory Visit: Payer: Medicare Other | Admitting: Adult Health

## 2015-07-15 ENCOUNTER — Other Ambulatory Visit: Payer: Self-pay | Admitting: Critical Care Medicine

## 2015-07-17 ENCOUNTER — Ambulatory Visit (INDEPENDENT_AMBULATORY_CARE_PROVIDER_SITE_OTHER): Payer: Medicare Other | Admitting: Adult Health

## 2015-07-17 ENCOUNTER — Telehealth: Payer: Self-pay | Admitting: Adult Health

## 2015-07-17 ENCOUNTER — Encounter: Payer: Self-pay | Admitting: Adult Health

## 2015-07-17 VITALS — BP 149/85 | HR 73 | Temp 97.4°F | Ht 66.5 in | Wt 233.0 lb

## 2015-07-17 DIAGNOSIS — J441 Chronic obstructive pulmonary disease with (acute) exacerbation: Secondary | ICD-10-CM

## 2015-07-17 DIAGNOSIS — J328 Other chronic sinusitis: Secondary | ICD-10-CM | POA: Diagnosis not present

## 2015-07-17 DIAGNOSIS — I10 Essential (primary) hypertension: Secondary | ICD-10-CM | POA: Diagnosis not present

## 2015-07-17 DIAGNOSIS — J440 Chronic obstructive pulmonary disease with acute lower respiratory infection: Principal | ICD-10-CM

## 2015-07-17 DIAGNOSIS — Z23 Encounter for immunization: Secondary | ICD-10-CM

## 2015-07-17 DIAGNOSIS — J209 Acute bronchitis, unspecified: Secondary | ICD-10-CM

## 2015-07-17 MED ORDER — BUDESONIDE 0.5 MG/2ML IN SUSP: 0.5000 mg | Freq: Two times a day (BID) | RESPIRATORY_TRACT | Status: AC

## 2015-07-17 MED ORDER — FORMOTEROL FUMARATE 20 MCG/2ML IN NEBU: 20.0000 ug | INHALATION_SOLUTION | Freq: Two times a day (BID) | RESPIRATORY_TRACT | Status: AC

## 2015-07-17 NOTE — Patient Instructions (Addendum)
[name] and then change to Budesonide and Perforomist Neb Twice daily  ( this is to avoid "doughnut hole" ) Flu shot today .  Follow up today with PCP for elevated blood pressure as planned .  Follow up with Dr. Vassie Loll  In 6 months and As needed  .

## 2015-07-17 NOTE — Telephone Encounter (Signed)
Spoke with Larita Fife at [facility]. She was confused in regards to the message about will bring rx to [facility] office. Informed her order was placed from [facility] office and that rx would be faxed in am. Nothing further needed.

## 2015-07-17 NOTE — Addendum Note (Signed)
Addended by: York Ram on: 07/17/2015 09:52 AM   Modules accepted: Orders

## 2015-07-17 NOTE — Progress Notes (Signed)
Reviewed & agree with plan  

## 2015-07-17 NOTE — Assessment & Plan Note (Signed)
HTN -not controlled.  Pt advised to see PCP today , takes meds as directed Low salt diet

## 2015-07-17 NOTE — Assessment & Plan Note (Signed)
Advised to use saline nasal As needed  For nasal congestion

## 2015-07-17 NOTE — Assessment & Plan Note (Signed)
Compensated on present regimen  Will need to change Dulera d/t cost issues  Change to nebs meds to help with cost   Plan  [facility] and then change to Budesonide and Perforomist Neb Twice daily  ( this is to avoid "doughnut hole" ) Flu shot today .  Follow up today with PCP for elevated blood pressure as planned .  Follow up with Dr. Vassie Loll  In 6 months and As needed  .

## 2015-07-17 NOTE — Progress Notes (Signed)
   Subjective:    Patient ID: [name], male    DOB: 16-Jan-1946, 68 y.o.   MRN: [id number]  HPI 69 yo former smoker with COPD GOLD C former Dr. Delford Field patient   07/17/2015 Follow up : COPD  Pt returns for 6 month follow up .  He says he is doing well overall.  PVX and Prevnar utd.  Last CXR 01/2015 with CODP changes .  Says he has cough and congestion in mornings. Has to clear throat to get mucus out.  Rest of day is fine with minimal cough and only clear mucus if any.  Pt is retired , says he does light activities around the house.  Denies chest pain,orthopnea , edema or hemoptysis .  Says [place] Sleight is too high and he is going into Doughnut hole. Needs to get this changed  So he can afford his meds.   B/p is elevated today, did not take meds,. Has ov with PCP today .  Advised to have b/p rechecked .      Review of Systems Constitutional:   No  weight loss, night sweats,  Fevers, chills, fatigue, or  lassitude.  HEENT:   No headaches,  Difficulty swallowing,  Tooth/dental problems, or  Sore throat,                No sneezing, itching, ear ache, + nasal congestion in am   CV:  No chest pain,  Orthopnea, PND, swelling in lower extremities, anasarca, dizziness, palpitations, syncope.   GI  No heartburn, indigestion, abdominal pain, nausea, vomiting, diarrhea, change in bowel habits, loss of appetite, bloody stools.   Resp: No shortness of breath with exertion or at rest.  No excess mucus, no productive cough,  No non-productive cough,  No coughing up of blood.  No change in color of mucus.  No wheezing.  No chest wall deformity  Skin: no rash or lesions.  GU: no dysuria, change in color of urine, no urgency or frequency.  No flank pain, no hematuria   MS:  No joint pain or swelling.  No decreased range of motion.  No back pain.  Psych:  No change in mood or affect. No depression or anxiety.  No memory loss.         Objective:   Physical Exam  GEN: A/Ox3;  pleasant , NAD, well nourished   HEENT:  [place]/AT,  EACs-clear, TMs-wnl, NOSE-clear, THROAT-clear, no lesions, no postnasal drip or exudate noted.   NECK:  Supple w/ fair ROM; no JVD; normal carotid impulses w/o bruits; no thyromegaly or nodules palpated; no lymphadenopathy.  RESP  Clear  P & A; w/o, wheezes/ rales/ or rhonchi.no accessory muscle use, no dullness to percussion  CARD:  RRR, no m/r/g  , no peripheral edema, pulses intact, no cyanosis or clubbing.  GI:   Soft & nt; nml bowel sounds; no organomegaly or masses detected.  Musco: Warm bil, no deformities or joint swelling noted.   Neuro: alert, no focal deficits noted.    Skin: Warm, no lesions or rashes        Assessment & Plan:

## 2015-08-26 DEATH — deceased

## 2016-08-21 IMAGING — CR DG CHEST 2V
2 series · 2 of 2 positions shown · non-contrast
Comparison: 02/13/2014

CLINICAL DATA: Cough, congestion, shortness of breath and wheezing
for 2 weeks.

EXAM:
CHEST  2 VIEW

[view not recorded (1 of 2)]
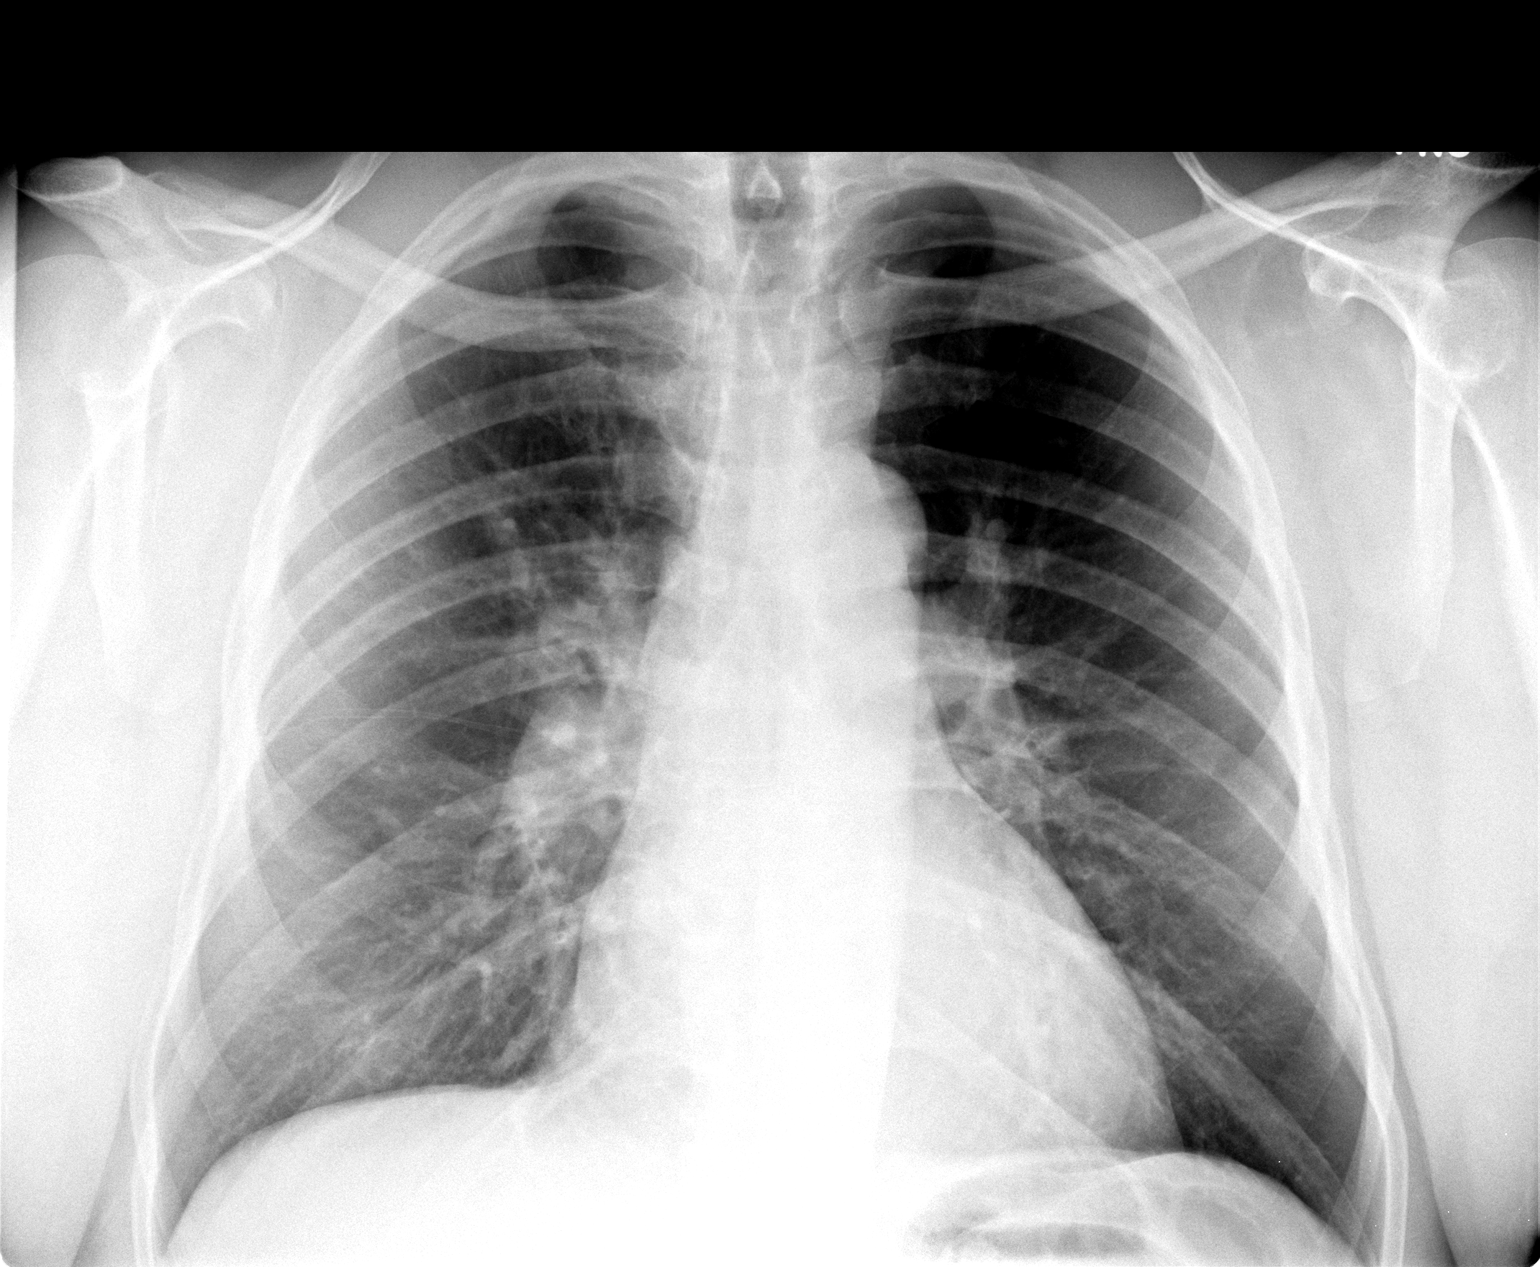

[view not recorded (2 of 2)]
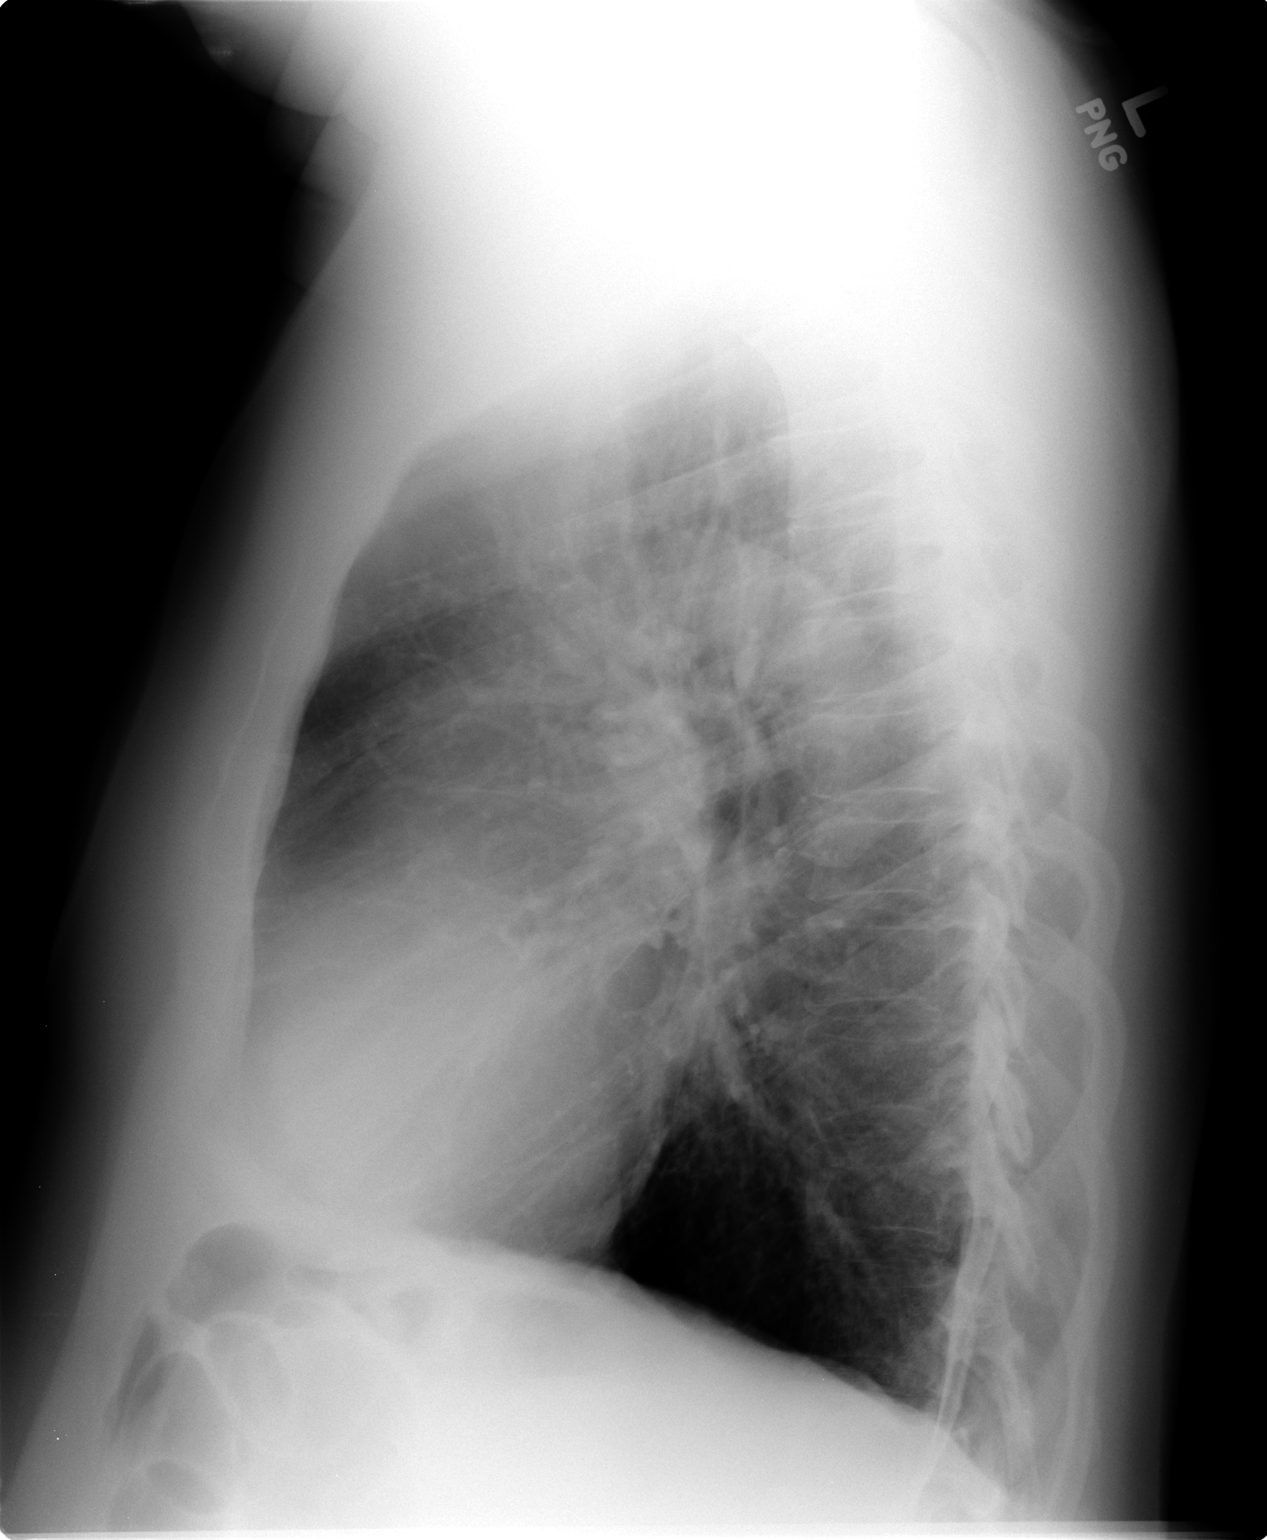

[2 of 2 positions shown; findings below may reference images not displayed]

FINDINGS: Hyperinflation of the lungs. Mild peribronchial thickening. Heart is
normal size. Mediastinal contours are within normal limits. No
effusions or confluent airspace opacities. No acute bony
abnormality.
IMPRESSION: Mild hyperinflation and peribronchial thickening.
